# Patient Record
Sex: Female | Born: 1945 | Race: White | Hispanic: No | Smoking: Never smoker
Health system: Southern US, Community
[De-identification: ages and names within clinical notes are randomized; demographics above are authoritative.]

## PROBLEM LIST (undated history)

## (undated) DIAGNOSIS — K219 Gastro-esophageal reflux disease without esophagitis: Secondary | ICD-10-CM

## (undated) DIAGNOSIS — G8929 Other chronic pain: Secondary | ICD-10-CM

## (undated) DIAGNOSIS — A809 Acute poliomyelitis, unspecified: Secondary | ICD-10-CM

## (undated) DIAGNOSIS — E559 Vitamin D deficiency, unspecified: Secondary | ICD-10-CM

## (undated) DIAGNOSIS — F329 Major depressive disorder, single episode, unspecified: Secondary | ICD-10-CM

## (undated) DIAGNOSIS — I10 Essential (primary) hypertension: Secondary | ICD-10-CM

---

## 2021-02-28 ENCOUNTER — Encounter (HOSPITAL_BASED_OUTPATIENT_CLINIC_OR_DEPARTMENT_OTHER): Payer: Self-pay

## 2021-02-28 ENCOUNTER — Emergency Department (HOSPITAL_BASED_OUTPATIENT_CLINIC_OR_DEPARTMENT_OTHER)
Admission: EM | Admit: 2021-02-28 | Discharge: 2021-02-28 | Disposition: A | Discharge status: Home / Self Care | Payer: Medicare Other | Attending: Emergency Medicine | Admitting: Emergency Medicine

## 2021-02-28 ENCOUNTER — Other Ambulatory Visit: Payer: Self-pay

## 2021-02-28 ENCOUNTER — Emergency Department (HOSPITAL_BASED_OUTPATIENT_CLINIC_OR_DEPARTMENT_OTHER): Payer: Medicare Other

## 2021-02-28 DIAGNOSIS — Z79899 Other long term (current) drug therapy: Secondary | ICD-10-CM | POA: Insufficient documentation

## 2021-02-28 DIAGNOSIS — I10 Essential (primary) hypertension: Secondary | ICD-10-CM | POA: Insufficient documentation

## 2021-02-28 DIAGNOSIS — S0990XA Unspecified injury of head, initial encounter: Secondary | ICD-10-CM | POA: Insufficient documentation

## 2021-02-28 DIAGNOSIS — S098XXA Other specified injuries of head, initial encounter: Secondary | ICD-10-CM

## 2021-02-28 DIAGNOSIS — W01198A Fall on same level from slipping, tripping and stumbling with subsequent striking against other object, initial encounter: Secondary | ICD-10-CM | POA: Diagnosis not present

## 2021-02-28 DIAGNOSIS — Y92129 Unspecified place in nursing home as the place of occurrence of the external cause: Secondary | ICD-10-CM | POA: Insufficient documentation

## 2021-02-28 HISTORY — DX: Vitamin D deficiency, unspecified: E55.9

## 2021-02-28 HISTORY — DX: Gastro-esophageal reflux disease without esophagitis: K21.9

## 2021-02-28 HISTORY — DX: Major depressive disorder, single episode, unspecified: F32.9

## 2021-02-28 HISTORY — DX: Other chronic pain: G89.29

## 2021-02-28 HISTORY — DX: Essential (primary) hypertension: I10

## 2021-02-28 HISTORY — DX: Acute poliomyelitis, unspecified: A80.9

## 2021-02-28 MED ORDER — HYDROCODONE-ACETAMINOPHEN 5-325 MG PO TABS
1.0000 | ORAL_TABLET | Freq: Once | ORAL | Status: AC
  Administered 2021-02-28: 1 via ORAL
  Filled 2021-02-28: qty 1

## 2021-02-28 NOTE — ED Triage Notes (Addendum)
Pt BIBA from [facility] for a fall. Pt was using her walker when she slipped and fell backwards and hit her head. Hematoma noted on the back of her head. AAOx4, not on blood thinners and did not have LOC. Denies any other injures. Did not have anything for pain.

## 2021-02-28 NOTE — Discharge Instructions (Addendum)
The CT scan of your brain did not reveal any skull fracture or internal brain bleed.  We suspect that you might have lingering headaches for the next few days.  Take over-the-counter medications for your headaches or your pain medications.  Be very careful with ambulation to prevent future falls.

## 2021-03-01 NOTE — ED Provider Notes (Signed)
[facility] EMERGENCY DEPT Provider Note   CSN: [id number] Arrival date & time: 02/28/21  1949     History Chief Complaint  Patient presents with   Marletta Lor    Katherine Welch is a 75 y.o. female.  HPI    75 year old female comes in with chief complaint of fall.  Patient lives at an assisted living facility.  She reports that she had a mechanical fall earlier today.  She fell backwards and struck the back of her head onto the hard surface.  No loss of consciousness.  Patient is having moderately severe posterior headache.  She denies any neck pain, focal numbness, weakness, vision change, seizure-like activity, confusion.  Patient denies pain elsewhere.  She is not on any blood thinners.  Daughter is at bedside, she provides collateral history as well.  Past Medical History:  Diagnosis Date   Chronic hip pain    GERD (gastroesophageal reflux disease)    Hypertension    Major depressive disorder    Polio    Vitamin D deficiency     There are no problems to display for this patient.    OB History   No obstetric history on file.     No family history on file.  Social History   Substance Use Topics   Alcohol use: Not Currently   Drug use: Never    Home Medications Prior to Admission medications   Medication Sig Start Date End Date Taking? Authorizing Provider  bisoprolol-hydrochlorothiazide (ZIAC) 5-6.25 MG tablet Take 1 tablet by mouth daily.   Yes [provider]  celecoxib (CELEBREX) 200 MG capsule Take 200 mg by mouth 2 (two) times daily.   Yes [provider]  citalopram (CELEXA) 40 MG tablet Take 40 mg by mouth daily.   Yes [provider]  DULoxetine (CYMBALTA) 60 MG capsule Take 60 mg by mouth daily.   Yes [provider]  famotidine (PEPCID) 40 MG tablet Take 40 mg by mouth daily.   Yes [provider]  fexofenadine (ALLEGRA) 180 MG tablet Take 180 mg by mouth daily.   Yes [provider]   fluticasone (FLONASE) 50 MCG/ACT nasal spray Place into both nostrils daily.   Yes [provider]  HYDROcodone-acetaminophen (NORCO) 10-325 MG tablet Take 1 tablet by mouth every 6 (six) hours as needed.   Yes [provider]  losartan (COZAAR) 50 MG tablet Take 50 mg by mouth daily.   Yes [provider]  Magnesium 250 MG TABS Take by mouth.   Yes [provider]  risedronate (ACTONEL) 150 MG tablet Take 150 mg by mouth every 30 (thirty) days. with water on empty stomach, nothing by mouth or lie down for next 30 minutes.   Yes [provider]  tiZANidine (ZANAFLEX) 4 MG tablet Take 4 mg by mouth every 6 (six) hours as needed for muscle spasms.   Yes [provider]  traZODone (DESYREL) 150 MG tablet Take 150 mg by mouth at bedtime.   Yes [provider]    Allergies    Biaxin [clarithromycin] and Penicillins  Review of Systems   Review of Systems  Constitutional:  Positive for activity change.  Eyes:  Negative for visual disturbance.  Neurological:  Positive for headaches.  Hematological:  Does not bruise/bleed easily.  All other systems reviewed and are negative.  Physical Exam Updated Vital Signs BP (!) 146/67 (BP Location: Right Arm)   Pulse 64   Temp 98.2 F (36.8 C) (Oral)  Resp 18   Ht 5\' 3"  (1.6 m)   Wt 74.4 kg   SpO2 96%   BMI 29.05 kg/m   Physical Exam Vitals and nursing note reviewed.  Constitutional:      Appearance: She is well-developed.  HENT:     Head: Atraumatic.  Eyes:     Extraocular Movements: Extraocular movements intact.     Pupils: Pupils are equal, round, and reactive to light.  Cardiovascular:     Rate and Rhythm: Normal rate.  Pulmonary:     Effort: Pulmonary effort is normal.  Musculoskeletal:        General: No swelling, tenderness, deformity or signs of injury.     Cervical back: Normal range of motion and neck supple.     Comments: Head to toe evaluation shows no hematoma,  bleeding of the scalp, no facial abrasions, no spine step offs, crepitus of the chest or neck, no tenderness to palpation of the bilateral upper and lower extremities, no gross deformities, no chest tenderness, no pelvic pain.   Skin:    General: Skin is warm and dry.  Neurological:     Mental Status: She is alert and oriented to person, place, and time.    ED Results / Procedures / Treatments   Labs (all labs ordered are listed, but only abnormal results are displayed) Labs Reviewed - No data to display  EKG None  Radiology CT Head Wo Contrast  Result Date: 02/28/2021 CLINICAL DATA:  Head trauma, minor (Age >= 65y) Head trauma, mod-severe. Fall, head injury EXAM: CT HEAD WITHOUT CONTRAST TECHNIQUE: Contiguous axial images were obtained from the base of the skull through the vertex without intravenous contrast. COMPARISON:  None. FINDINGS: Brain: Normal anatomic configuration. Parenchymal volume loss is commensurate with the patient's age. Mild periventricular white matter changes are present likely reflecting the sequela of small vessel ischemia. No abnormal intra or extra-axial mass lesion or fluid collection. No abnormal mass effect or midline shift. No evidence of acute intracranial hemorrhage or infarct. Ventricular size is normal. Cerebellum unremarkable. Vascular: No asymmetric hyperdense vasculature at the skull base. Skull: Intact Sinuses/Orbits: Paranasal sinuses are clear. Orbits are unremarkable. Other: Mastoid air cells and middle ear cavities are clear. IMPRESSION: No acute intracranial injury. No calvarial fracture. Mild senescent change. Electronically Signed   By: 03/02/2021 M.D.   On: 02/28/2021 21:17    Procedures Procedures   Medications Ordered in ED Medications  HYDROcodone-acetaminophen (NORCO/VICODIN) 5-325 MG per tablet 1 tablet (1 tablet Oral Given 02/28/21 2044)    ED Course  I have reviewed the triage vital signs and the nursing notes.  Pertinent labs &  imaging results that were available during my care of the patient were reviewed by me and considered in my medical decision making (see chart for details).    MDM Rules/Calculators/A&P                            75 year old comes in a chief complaint of headaches. Patient had a mechanical fall earlier today.  She has a hematoma over her occiput.  CT scan ordered, it is negative for any intracranial bleed or skull fracture.  C-spine was cleared clinically.  Her MS exam was overall reassuring and no radiographs are indicated.  Stable for discharge  Final Clinical Impression(s) / ED Diagnoses Final diagnoses:  Blunt head trauma, initial encounter    Rx / DC Orders ED Discharge Orders  None        Derwood Kaplan, MD 03/01/21 [phone]

## 2021-08-05 ENCOUNTER — Emergency Department (HOSPITAL_BASED_OUTPATIENT_CLINIC_OR_DEPARTMENT_OTHER): Payer: Medicare Other

## 2021-08-05 ENCOUNTER — Emergency Department (HOSPITAL_BASED_OUTPATIENT_CLINIC_OR_DEPARTMENT_OTHER): Payer: Medicare Other | Admitting: Radiology

## 2021-08-05 ENCOUNTER — Emergency Department (HOSPITAL_BASED_OUTPATIENT_CLINIC_OR_DEPARTMENT_OTHER)
Admission: EM | Admit: 2021-08-05 | Discharge: 2021-08-05 | Disposition: A | Discharge status: Home / Self Care | Payer: Medicare Other | Attending: Emergency Medicine | Admitting: Emergency Medicine

## 2021-08-05 ENCOUNTER — Encounter (HOSPITAL_BASED_OUTPATIENT_CLINIC_OR_DEPARTMENT_OTHER): Payer: Self-pay | Admitting: Emergency Medicine

## 2021-08-05 ENCOUNTER — Other Ambulatory Visit: Payer: Self-pay

## 2021-08-05 DIAGNOSIS — S0990XA Unspecified injury of head, initial encounter: Secondary | ICD-10-CM | POA: Diagnosis present

## 2021-08-05 DIAGNOSIS — W19XXXA Unspecified fall, initial encounter: Secondary | ICD-10-CM

## 2021-08-05 DIAGNOSIS — R296 Repeated falls: Secondary | ICD-10-CM | POA: Diagnosis not present

## 2021-08-05 DIAGNOSIS — R102 Pelvic and perineal pain: Secondary | ICD-10-CM

## 2021-08-05 DIAGNOSIS — W01198A Fall on same level from slipping, tripping and stumbling with subsequent striking against other object, initial encounter: Secondary | ICD-10-CM | POA: Diagnosis not present

## 2021-08-05 MED ORDER — FENTANYL CITRATE PF 50 MCG/ML IJ SOSY
100.0000 ug | PREFILLED_SYRINGE | Freq: Once | INTRAMUSCULAR | Status: AC
  Administered 2021-08-05: 22:00:00 100 ug via INTRAMUSCULAR
  Filled 2021-08-05: qty 2

## 2021-08-05 MED ORDER — OXYCODONE-ACETAMINOPHEN 5-325 MG PO TABS
1.0000 | ORAL_TABLET | Freq: Once | ORAL | Status: AC
  Administered 2021-08-05: 21:00:00 1 via ORAL
  Filled 2021-08-05: qty 1

## 2021-08-05 MED ORDER — KETOROLAC TROMETHAMINE 30 MG/ML IJ SOLN
30.0000 mg | Freq: Once | INTRAMUSCULAR | Status: AC
  Administered 2021-08-05: 21:00:00 30 mg via INTRAMUSCULAR
  Filled 2021-08-05: qty 1

## 2021-08-05 NOTE — ED Triage Notes (Signed)
Pt BIB by [facility] , was standing up and fell hitting left hip than rt and then back of head has a bump  there, no loc has  hx of polio as child walks with brace on rt leg

## 2021-08-05 NOTE — Discharge Instructions (Addendum)
Follow-up with your doctors for the pain.  Your CT scan was reassuring today and showed some arthritis but no acute fracture.  Take your pain medicines to help with the pain

## 2021-08-05 NOTE — ED Provider Notes (Signed)
[facility]   CSN: [id number] Arrival date & time: 08/05/21  1806     History  Chief Complaint  Patient presents with   Marletta LorFall    Katherine Welch is a 76 y.o. female.   Fall Pertinent negatives include no abdominal pain, no headaches and no shortness of breath. Patient presents after fall.  Mechanical fall.  Lost her balance and landed onto her rear end.  Complaining of pain in her rear end and both hips.  Also hit her head.  No loss conscious.  Does have previous history of polio and is on state baseline.  States she has not fallen in the last year which is actually pretty good for her.  No loss conscious.  Not on blood thinners.  Has been ambulatory to the bathroom since the fall.  No back pain.  No chest pain.     Home Medications Prior to Admission medications   Medication Sig Start Date End Date Taking? Authorizing Provider  bisoprolol-hydrochlorothiazide (ZIAC) 5-6.25 MG tablet Take 1 tablet by mouth daily.    [provider]  celecoxib (CELEBREX) 200 MG capsule Take 200 mg by mouth 2 (two) times daily.    [provider]  citalopram (CELEXA) 40 MG tablet Take 40 mg by mouth daily.    [provider]  DULoxetine (CYMBALTA) 60 MG capsule Take 60 mg by mouth daily.    [provider]  famotidine (PEPCID) 40 MG tablet Take 40 mg by mouth daily.    [provider]  fexofenadine (ALLEGRA) 180 MG tablet Take 180 mg by mouth daily.    [provider]  fluticasone (FLONASE) 50 MCG/ACT nasal spray Place into both nostrils daily.    [provider]  HYDROcodone-acetaminophen (NORCO) 10-325 MG tablet Take 1 tablet by mouth every 6 (six) hours as needed.    [provider]  losartan (COZAAR) 50 MG tablet Take 50 mg by mouth daily.    [provider]  Magnesium 250 MG TABS Take by mouth.    [provider]  risedronate (ACTONEL) 150 MG tablet Take 150 mg  by mouth every 30 (thirty) days. with water on empty stomach, nothing by mouth or lie down for next 30 minutes.    [provider]  tiZANidine (ZANAFLEX) 4 MG tablet Take 4 mg by mouth every 6 (six) hours as needed for muscle spasms.    [provider]  traZODone (DESYREL) 150 MG tablet Take 150 mg by mouth at bedtime.    [provider]      Allergies    Biaxin [clarithromycin] and Penicillins    Review of Systems   Review of Systems  Constitutional:  Negative for appetite change.  Respiratory:  Negative for shortness of breath.   Cardiovascular:  Negative for leg swelling.  Gastrointestinal:  Negative for abdominal pain.  Musculoskeletal:        Pelvic pain.  Bilateral hip pain.  Skin:  Negative for rash.  Neurological:  Negative for weakness and headaches.  Psychiatric/Behavioral:  Negative for confusion.    Physical Exam Updated Vital Signs BP (!) 189/84    Pulse 90    Temp 97.6 F (36.4 C)    Resp 18    Ht 5\' 5"  (1.651 m)    Wt 83.9 kg    SpO2 96%    BMI 30.79 kg/m  Physical Exam Vitals and nursing note reviewed.  HENT:     Head:  Comments: Hematoma right occipital area. Pulmonary:     Breath sounds: No wheezing or rhonchi.  Abdominal:     Tenderness: There is no abdominal tenderness.  Musculoskeletal:     Comments: Pain in posterior pelvis with raising of either lower extremity.  Good range of motion in hips.  Some tenderness over inferior sacrum/coccyx.  No thoracic or lumbar or cervical spine tenderness  Skin:    General: Skin is warm.     Capillary Refill: Capillary refill takes more than 3 seconds.  Neurological:     Mental Status: She is alert and oriented to person, place, and time.    ED Results / Procedures / Treatments   Labs (all labs ordered are listed, but only abnormal results are displayed) Labs Reviewed - No data to display  EKG None  Radiology CT Head Wo Contrast  Result Date: 08/05/2021 CLINICAL DATA:  Trauma  EXAM: CT HEAD WITHOUT CONTRAST TECHNIQUE: Contiguous axial images were obtained from the base of the skull through the vertex without intravenous contrast. RADIATION DOSE REDUCTION: This exam was performed according to the departmental dose-optimization program which includes automated exposure control, adjustment of the mA and/or kV according to patient size and/or use of iterative reconstruction technique. COMPARISON:  02/28/2021 FINDINGS: Brain: No acute intracranial findings are seen. There is no evidence of intracranial bleeding. Cortical sulci are prominent. There is decreased density in periventricular and subcortical white matter. Vascular: Unremarkable. Skull: No fracture is seen. There is subcutaneous contusion/hematoma in the posterior right parietal scalp. Sinuses/Orbits: Unremarkable. Other: There is increased amount of CSF insula suggesting partial empty sella. IMPRESSION: No acute intracranial findings are seen. Atrophy. Small-vessel disease. There is subcutaneous contusion/hematoma in the right parietal scalp without demonstrable fracture in calvarium. Electronically Signed   By: Ernie Avena M.D.   On: 08/05/2021 19:16   CT Hip Left Wo Contrast  Result Date: 08/05/2021 CLINICAL DATA:  Trauma to the left hip.  Concern for fracture. EXAM: CT OF THE LEFT HIP WITHOUT CONTRAST TECHNIQUE: Multidetector CT imaging of the left hip was performed according to the standard protocol. Multiplanar CT image reconstructions were also generated. RADIATION DOSE REDUCTION: This exam was performed according to the departmental dose-optimization program which includes automated exposure control, adjustment of the mA and/or kV according to patient size and/or use of iterative reconstruction technique. COMPARISON:  Left hip radiograph dated 08/05/2021. FINDINGS: Bones/Joint/Cartilage There is no acute fracture or dislocation. 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 Old healed fracture of the left pubic bone.  Ligaments Suboptimally assessed by CT. Muscles and Tendons No acute findings.  No intramuscular fluid collection or hematoma. Soft tissues Sigmoid diverticulosis. IMPRESSION: 1. No acute fracture or dislocation. 2. 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 Electronically Signed   By: Elgie Collard M.D.   On: 08/05/2021 20:48   DG Hips Bilat W or Wo Pelvis 5 Views  Result Date: 08/05/2021 CLINICAL DATA:  Bilateral hip pain following a fall. EXAM: DG HIP (WITH OR WITHOUT PELVIS) 5+V BILAT COMPARISON:  None. FINDINGS: Diffuse osteopenia. Bony hypertrophy of the left pubic bone with an appearance compatible with an old, healed fracture. There is also a possible small area of cortical disruption in the superior aspect of the left pubic bone. Also demonstrated is a small cortical irregularity with an appearance suggesting a minimally impacted fracture of the left subcapital femoral neck. The right hip is intact with no fracture or dislocation seen. Mild bilateral hip degenerative spur formation is noted. Also noted are  degenerative changes in the lower lumbar spine. Multiple right inguinal and thigh surgical clips are noted. IMPRESSION: 1. Possible mildly impacted left subcapital femoral neck fracture. 2. Probable old, healed left pubic body fracture with a possible acute component superiorly. 3. No right hip fracture or dislocation seen. Recommendation: Left hip CT without contrast. Electronically Signed   By: Beckie Salts M.D.   On: 08/05/2021 19:11    Procedures Procedures    Medications Ordered in ED Medications  ketorolac (TORADOL) 30 MG/ML injection 30 mg (30 mg Intramuscular Given 08/05/21 2058)  oxyCODONE-acetaminophen (PERCOCET/ROXICET) 5-325 MG per tablet 1 tablet (1 tablet Oral Given 08/05/21 2058)  fentaNYL (SUBLIMAZE) injection 100 mcg (100 mcg Intramuscular Given 08/05/21 2146)    ED Course/ Medical Decision Making/ A&P                           Medical Decision Making Problems  Addressed: Fall, initial encounter: acute illness or injury Minor head injury, initial encounter: acute illness or injury Pelvic pain: acute illness or injury  Amount and/or Complexity of Data Reviewed Radiology: ordered.  Risk Prescription drug management.   Patient with mechanical fall.  History of polio which makes her somewhat unsteady at baseline.  States she does have rather frequent falls.  Complaining of pain in an pelvis and hips.  Head CT done due to hematoma and independently reviewed and showed no hemorrhage.  Independently reviewed and interpreted x-rays also.  Possible left-sided femoral neck fracture.  Also potential old pubic body versus new fracture.  CT scan done and also reviewed and interpreted by me.  No acute fracture.  Patient with continued pain.  Treated with IV pain medicines here.  Already has hydrocodone 10 mg at home.  We will continue this and follow-up with her doctors.  No other apparent injury.  Discharge home.        Final Clinical Impression(s) / ED Diagnoses Final diagnoses:  Fall, initial encounter  Pelvic pain  Minor head injury, initial encounter    Rx / DC Orders ED Discharge Orders     None         Benjiman Core, MD 08/06/21 1230

## 2021-08-05 NOTE — ED Notes (Signed)
Pt verbalizes understanding of discharge instructions. Opportunity for questioning and answers were provided. Pt discharged from ED to home with daughter. Attempted to call report to the facility x4. No answer from facility. Instructions reviewed with daughter and pt with understanding verbalized by both. Pt POV with daughter to facility

## 2021-08-13 ENCOUNTER — Emergency Department (HOSPITAL_COMMUNITY): Payer: Medicare Other

## 2021-08-13 ENCOUNTER — Emergency Department (HOSPITAL_COMMUNITY)
Admission: EM | Admit: 2021-08-13 | Discharge: 2021-08-13 | Disposition: A | Discharge status: Home / Self Care | Payer: Medicare Other | Attending: Student | Admitting: Student

## 2021-08-13 DIAGNOSIS — Z79899 Other long term (current) drug therapy: Secondary | ICD-10-CM | POA: Insufficient documentation

## 2021-08-13 DIAGNOSIS — S32000A Wedge compression fracture of unspecified lumbar vertebra, initial encounter for closed fracture: Secondary | ICD-10-CM

## 2021-08-13 DIAGNOSIS — W19XXXA Unspecified fall, initial encounter: Secondary | ICD-10-CM | POA: Insufficient documentation

## 2021-08-13 DIAGNOSIS — I10 Essential (primary) hypertension: Secondary | ICD-10-CM | POA: Insufficient documentation

## 2021-08-13 DIAGNOSIS — S3210XA Unspecified fracture of sacrum, initial encounter for closed fracture: Secondary | ICD-10-CM

## 2021-08-13 DIAGNOSIS — M25551 Pain in right hip: Secondary | ICD-10-CM | POA: Insufficient documentation

## 2021-08-13 DIAGNOSIS — M533 Sacrococcygeal disorders, not elsewhere classified: Secondary | ICD-10-CM | POA: Insufficient documentation

## 2021-08-13 MED ORDER — MORPHINE SULFATE (PF) 4 MG/ML IV SOLN
4.0000 mg | Freq: Once | INTRAVENOUS | Status: AC
  Administered 2021-08-13: 4 mg via INTRAMUSCULAR
  Filled 2021-08-13: qty 1

## 2021-08-13 MED ORDER — HYDROCODONE-ACETAMINOPHEN 5-325 MG PO TABS
1.0000 | ORAL_TABLET | Freq: Once | ORAL | Status: AC
  Administered 2021-08-13: 1 via ORAL
  Filled 2021-08-13: qty 1

## 2021-08-13 MED ORDER — OXYCODONE HCL 5 MG PO TABS
5.0000 mg | ORAL_TABLET | Freq: Once | ORAL | Status: AC
  Administered 2021-08-13: 5 mg via ORAL
  Filled 2021-08-13: qty 1

## 2021-08-13 MED ORDER — HYDROCODONE-ACETAMINOPHEN 5-325 MG PO TABS
2.0000 | ORAL_TABLET | ORAL | 0 refills | Status: DC | PRN
Start: 2021-08-13 — End: 2021-11-06

## 2021-08-13 NOTE — ED Notes (Signed)
[facility] called for report. RN phone goes to voicemail x2. Message left. Family agrees to transport patient home.

## 2021-08-13 NOTE — ED Triage Notes (Signed)
Patient BIB [facility] from [facility] With complaint of lower back pain after a fall last week. Facility has been giving patient tylenol but patient states that does not help the pain. Patient ambulates with a walker a baseline, facility states patient is still able to ambulate with Rolator but more slowly.  EMS Vitals BP 186/92 HR 78 RR 20 SpO2 97% on room air Oral temp 97.6

## 2021-08-13 NOTE — ED Notes (Signed)
Pt verbalized understanding of d/c instructions, meds, and followup care. Denies questions. VSS, no distress noted. [facility] called x[facility]-message left. Family is wanting to take patient home now. Assisted to wheelchair x2. Helped into daughter's car.

## 2021-08-13 NOTE — ED Provider Notes (Signed)
[facility] EMERGENCY DEPARTMENT Provider Note  CSN: [id number] Arrival date & time: 08/13/21 0370  Chief Complaint(s) Fall  HPI Katherine Welch is a 76 y.o. 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  Patient had a fall 6 days ago and was evaluated in the emergency department with no acute traumatic injuries and states that 2 days after evaluation he had severe worsening of pain in the sacrum and in the right hip.  Previous trauma imaging on 08/05/2021 was CT of the left hip, head and x-rays of hips.  She states that her current facility has not been giving her oxycodone despite being prescribed this medication.  She currently denies chest pain, shortness of breath, headache, nausea, vomiting or other systemic symptoms or traumatic complaints.   Fall   Past Medical History Past Medical History:  Diagnosis Date   Chronic hip pain    GERD (gastroesophageal reflux disease)    Hypertension    Major depressive disorder    Polio    Vitamin D deficiency    There are no problems to display for this patient.  Home Medication(s) Prior to Admission medications   Medication Sig Start Date End Date Taking? Authorizing Provider  bisoprolol-hydrochlorothiazide (ZIAC) 5-6.25 MG tablet Take 1 tablet by mouth daily.    [provider]  celecoxib (CELEBREX) 200 MG capsule Take 200 mg by mouth 2 (two) times daily.    [provider]  citalopram (CELEXA) 40 MG tablet Take 40 mg by mouth daily.    [provider]  DULoxetine (CYMBALTA) 60 MG capsule Take 60 mg by mouth daily.    [provider]  famotidine (PEPCID) 40 MG tablet Take 40 mg by mouth daily.    [provider]  fexofenadine (ALLEGRA) 180 MG tablet Take 180 mg by mouth daily.    [provider]  fluticasone (FLONASE) 50  MCG/ACT nasal spray Place into both nostrils daily.    [provider]  HYDROcodone-acetaminophen (NORCO) 10-325 MG tablet Take 1 tablet by mouth every 6 (six) hours as needed.    [provider]  losartan (COZAAR) 50 MG tablet Take 50 mg by mouth daily.    [provider]  Magnesium 250 MG TABS Take by mouth.    [provider]  risedronate (ACTONEL) 150 MG tablet Take 150 mg by mouth every 30 (thirty) days. with water on empty stomach, nothing by mouth or lie down for next 30 minutes.    [provider]  tiZANidine (ZANAFLEX) 4 MG tablet Take 4 mg by mouth every 6 (six) hours as needed for muscle spasms.    [provider]  traZODone (DESYREL) 150 MG tablet Take 150 mg by mouth at bedtime.    [provider]  Past Surgical History No past surgical history on file. Family History No family history on file.  Social History Social History   Tobacco Use   Smoking status: Never    Passive exposure: Never   Smokeless tobacco: Never  Vaping Use   Vaping Use: Never used  Substance Use Topics   Alcohol use: Not Currently   Drug use: Never   Allergies Biaxin [clarithromycin] and Penicillins  Review of Systems Review of Systems  Musculoskeletal:  Positive for arthralgias and back pain.   Physical Exam Vital Signs  I have reviewed the triage vital signs BP (!) 164/69 (BP Location: Right Arm)    Pulse 79    Temp 97.9 F (36.6 C) (Oral)    Resp 15    Ht 5\' 5"  (1.651 m)    Wt 83.9 kg    SpO2 98%    BMI 30.78 kg/m   Physical Exam Vitals and nursing note reviewed.  Constitutional:      General: She is not in acute distress.    Appearance: She is well-developed.  HENT:     Head: Normocephalic and atraumatic.  Eyes:     Conjunctiva/sclera: Conjunctivae normal.  Cardiovascular:     Rate and  Rhythm: Normal rate and regular rhythm.     Heart sounds: No murmur heard. Pulmonary:     Effort: Pulmonary effort is normal. No respiratory distress.     Breath sounds: Normal breath sounds.  Abdominal:     Palpations: Abdomen is soft.     Tenderness: There is no abdominal tenderness.  Musculoskeletal:        General: Tenderness (Right hip, L-spine) present. No swelling.     Cervical back: Neck supple.  Skin:    General: Skin is warm and dry.     Capillary Refill: Capillary refill takes less than 2 seconds.  Neurological:     Mental Status: She is alert.  Psychiatric:        Mood and Affect: Mood normal.    ED Results and Treatments Labs (all labs ordered are listed, but only abnormal results are displayed) Labs Reviewed - No data to display                                                                                                                        Radiology No results found.  Pertinent labs & imaging results that were available during my care of the patient were reviewed by me and considered in my medical decision making (see MDM for details).  Medications Ordered in ED Medications  HYDROcodone-acetaminophen (NORCO/VICODIN) 5-325 MG per tablet 1 tablet (1 tablet Oral Given 08/13/21 1024)  Procedures Procedures  (including critical care time)  Medical Decision Making / ED Course   This patient presents to the ED for concern of back pain after a fall, this involves an extensive number of treatment options, and is a complaint that carries with it a high risk of complications and morbidity.  The differential diagnosis includes sacral fracture, compression fracture, ligamentous injury, hip fracture,  MDM: Patient seen emergency department for evaluation of back pain after a fall.  Physical exam reveals point tenderness in the sacrum and  tenderness with external rotation of the right hip.  CT imaging reveals multiple lumbar compression fractures, age-indeterminate at T12, likely chronic at L4, acute sacral fracture at S2.  CT hip with no acute hip fracture.  I discussed these findings with the patient and she is adamantly states that she does not want surgery regardless on neurosurgery recommendations.  Neurosurgery consulted who states that the patient's mindset is reasonable in this case and is recommending LSO bracing and pain control.  I provided the patient with a short-term refill of her pain medication and an LSO brace was applied.  Patient was then discharged back to her facility.  She has an appointment with a geriatric physician next week but [facility] and orthopedic follow-up was given in the discharge instructions.   Additional history obtained: -Additional history obtained from daughter -External records from outside source obtained and reviewed including: Chart review including previous notes, labs, imaging, consultation notes   Lab Tests: -I ordered, reviewed, and interpreted labs.   The pertinent results include:   Labs Reviewed - No data to display   Imaging Studies ordered: I ordered imaging studies including CT L spine, CT R hip I independently visualized and interpreted imaging. I agree with the radiologist interpretation   Medicines ordered and prescription drug management: Meds ordered this encounter  Medications   HYDROcodone-acetaminophen (NORCO/VICODIN) 5-325 MG per tablet 1 tablet    -I have reviewed the patients home medicines and have made adjustments as needed  Critical interventions none  Consultations Obtained: I requested consultation with the surgeon,  and discussed lab and imaging findings as well as pertinent plan - they recommend: LSO bracing and outpatient follow-up   Cardiac Monitoring: The patient was maintained on a cardiac monitor.  I personally viewed and  interpreted the cardiac monitored which showed an underlying rhythm of: Normal sinus rhythm  Social Determinants of Health:  Factors impacting patients care include: none   Reevaluation: After the interventions noted above, I reevaluated the patient and found that they have :improved  Co morbidities that complicate the patient evaluation  Past Medical History:  Diagnosis Date   Chronic hip pain    GERD (gastroesophageal reflux disease)    Hypertension    Major depressive disorder    Polio    Vitamin D deficiency       Dispostion: I considered admission for this patient, but the patient does not meet inpatient criteria and is safe for outpatient follow-up.       Final Clinical Impression(s) / ED Diagnoses Final diagnoses:  None     @PCDICTATION @    Glendora ScoreKommor, Cassundra Mckeever, MD 08/13/21 [phone]

## 2021-08-13 NOTE — ED Notes (Signed)
Called ptar for pt #14 on the list

## 2021-08-13 NOTE — Progress Notes (Signed)
Orthopedic Tech Progress Note Patient Details:  Katherine Welch 05-18-1946 [id number]  Ortho Devices Type of Ortho Device: Lumbar corsett Ortho Device/Splint Interventions: Ordered, Application, Adjustment   Post Interventions Patient Tolerated: Well Instructions Provided: Adjustment of device, Care of device Applied LSO rather than TLSO per request of MD.  Darleen Crocker 08/13/2021, 2:53 PM

## 2021-08-13 NOTE — ED Notes (Signed)
Called CT for ETA.  

## 2021-08-16 ENCOUNTER — Emergency Department (HOSPITAL_BASED_OUTPATIENT_CLINIC_OR_DEPARTMENT_OTHER)
Admission: EM | Admit: 2021-08-16 | Discharge: 2021-08-16 | Disposition: A | Discharge status: Home / Self Care | Payer: Medicare Other | Attending: Emergency Medicine | Admitting: Emergency Medicine

## 2021-08-16 ENCOUNTER — Encounter (HOSPITAL_BASED_OUTPATIENT_CLINIC_OR_DEPARTMENT_OTHER): Payer: Self-pay | Admitting: Emergency Medicine

## 2021-08-16 DIAGNOSIS — F29 Unspecified psychosis not due to a substance or known physiological condition: Secondary | ICD-10-CM | POA: Insufficient documentation

## 2021-08-16 DIAGNOSIS — Z20822 Contact with and (suspected) exposure to covid-19: Secondary | ICD-10-CM | POA: Diagnosis not present

## 2021-08-16 DIAGNOSIS — F329 Major depressive disorder, single episode, unspecified: Secondary | ICD-10-CM | POA: Diagnosis not present

## 2021-08-16 DIAGNOSIS — W19XXXD Unspecified fall, subsequent encounter: Secondary | ICD-10-CM | POA: Insufficient documentation

## 2021-08-16 DIAGNOSIS — R45851 Suicidal ideations: Secondary | ICD-10-CM | POA: Diagnosis not present

## 2021-08-16 DIAGNOSIS — S3992XD Unspecified injury of lower back, subsequent encounter: Secondary | ICD-10-CM | POA: Diagnosis present

## 2021-08-16 DIAGNOSIS — S322XXD Fracture of coccyx, subsequent encounter for fracture with routine healing: Secondary | ICD-10-CM | POA: Insufficient documentation

## 2021-08-16 DIAGNOSIS — G8929 Other chronic pain: Secondary | ICD-10-CM | POA: Diagnosis not present

## 2021-08-16 DIAGNOSIS — S3210XD Unspecified fracture of sacrum, subsequent encounter for fracture with routine healing: Secondary | ICD-10-CM | POA: Insufficient documentation

## 2021-08-16 DIAGNOSIS — F4329 Adjustment disorder with other symptoms: Secondary | ICD-10-CM | POA: Insufficient documentation

## 2021-08-16 LAB — COMPREHENSIVE METABOLIC PANEL
ALT: 9 U/L (ref 0–44)
AST: 12 U/L — ABNORMAL LOW (ref 15–41)
Albumin: 3.9 g/dL (ref 3.5–5.0)
Alkaline Phosphatase: 98 U/L (ref 38–126)
Anion gap: 9 (ref 5–15)
BUN: 23 mg/dL (ref 8–23)
CO2: 27 mmol/L (ref 22–32)
Calcium: 10.8 mg/dL — ABNORMAL HIGH (ref 8.9–10.3)
Chloride: 101 mmol/L (ref 98–111)
Creatinine, Ser: 0.9 mg/dL (ref 0.44–1.00)
GFR, Estimated: >60 mL/min (ref >60)
Glucose, Bld: 156 mg/dL — ABNORMAL HIGH (ref 70–99)
Potassium: 3.2 mmol/L — ABNORMAL LOW (ref 3.5–5.1)
Sodium: 137 mmol/L (ref 135–145)
Total Bilirubin: 0.7 mg/dL (ref 0.3–1.2)
Total Protein: 7.3 g/dL (ref 6.5–8.1)

## 2021-08-16 LAB — CBC WITH DIFFERENTIAL/PLATELET
Abs Immature Granulocytes: 0.07 10*3/uL (ref 0.00–0.07)
Basophils Absolute: 0.1 10*3/uL (ref 0.0–0.1)
Basophils Relative: 1 %
Eosinophils Absolute: 0.1 10*3/uL (ref 0.0–0.5)
Eosinophils Relative: 1 %
HCT: 39.2 % (ref 36.0–46.0)
Hemoglobin: 12.9 g/dL (ref 12.0–15.0)
Immature Granulocytes: 1 %
Lymphocytes Relative: 17 %
Lymphs Abs: 1.9 10*3/uL (ref 0.7–4.0)
MCH: 28.9 pg (ref 26.0–34.0)
MCHC: 32.9 g/dL (ref 30.0–36.0)
MCV: 87.7 fL (ref 80.0–100.0)
Monocytes Absolute: 0.8 10*3/uL (ref 0.1–1.0)
Monocytes Relative: 7 %
Neutro Abs: 7.8 10*3/uL — ABNORMAL HIGH (ref 1.7–7.7)
Neutrophils Relative %: 73 %
Platelets: 320 10*3/uL (ref 150–400)
RBC: 4.47 MIL/uL (ref 3.87–5.11)
RDW: 12.6 % (ref 11.5–15.5)
WBC: 10.7 10*3/uL — ABNORMAL HIGH (ref 4.0–10.5)
nRBC: 0 % (ref 0.0–0.2)

## 2021-08-16 LAB — RESP PANEL BY RT-PCR (FLU A&B, COVID) ARPGX2
Influenza A by PCR: NEGATIVE
Influenza B by PCR: NEGATIVE
SARS Coronavirus 2 by RT PCR: NEGATIVE

## 2021-08-16 LAB — RAPID URINE DRUG SCREEN, HOSP PERFORMED
Amphetamines: NOT DETECTED
Barbiturates: NOT DETECTED
Benzodiazepines: NOT DETECTED
Cocaine: NOT DETECTED
Opiates: POSITIVE — AB
Tetrahydrocannabinol: NOT DETECTED

## 2021-08-16 LAB — ETHANOL: Alcohol, Ethyl (B): 10 mg/dL — ABNORMAL HIGH (ref <10)

## 2021-08-16 MED ORDER — HYDROMORPHONE HCL 1 MG/ML IJ SOLN
1.0000 mg | Freq: Once | INTRAMUSCULAR | Status: AC
  Administered 2021-08-16: 1 mg via INTRAVENOUS
  Filled 2021-08-16: qty 1

## 2021-08-16 MED ORDER — OXYCODONE-ACETAMINOPHEN 5-325 MG PO TABS: 1.0000 | ORAL_TABLET | Freq: Once | ORAL | Status: DC

## 2021-08-16 NOTE — ED Notes (Signed)
T.T.S. is beginning at this time.

## 2021-08-16 NOTE — ED Provider Notes (Signed)
[facility] EMERGENCY DEPT Provider Note   CSN: [id number] Arrival date & time: 08/16/21  1114     History  Chief Complaint  Patient presents with   Back Pain    Katherine Welch is a 76 y.o. female.  This is a 76 year old patient who was recently diagnosed with a broken sacrum who presents to the ED for worsening pain and suicidal ideation.  Patient initially had a fall on 08/04/2021, however the broken tailbone was not noticed until she came back to the emergency department on 08/13/2021.  She was given a back brace and her pain medications were adjusted patient so that patient can take smaller doses of hydrocodone more frequently throughout the day instead of less frequent higher doses.  Currently she is on hydrocodone 5 mg at home, and she does not wear the back brace at her facility.  She states her pain is not being managed appropriately at this dose.  Per daughter, patient called her this morning hysterical crying in pain, saying that if she could not get a handle on this pain she was going to kill her self.  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  When I asked patient if she wanted to hurt her self, she said "I do not know I just want my pain to go away".  She denies saddle paresthesia, urinary or bowel incontinence or retention.  She has no numbness or tingling.  Patient states she is able to walk with her walker, however daughter states that recently she has taken to letting people push her around in a wheelchair.  Although patient lives at a living facility where patient has help moving around, daughter also has concerns about transferring patient to and from her appointments given the decrease in mobility.  She has no other complaints at this time.   Back Pain Associated symptoms: no abdominal pain, no fever and no headaches       Home Medications Prior to Admission  medications   Medication Sig Start Date End Date Taking? Authorizing Provider  acetaminophen (TYLENOL) 500 MG tablet Take 500 mg by mouth every 8 (eight) hours as needed (pain).    [provider]  atorvastatin (LIPITOR) 10 MG tablet Take 10 mg by mouth at bedtime.    [provider]  bisoprolol-hydrochlorothiazide (ZIAC) 5-6.25 MG tablet Take 1 tablet by mouth every morning.    [provider]  celecoxib (CELEBREX) 200 MG capsule Take 200 mg by mouth 2 (two) times daily. 8am, 5pm    [provider]  Cholecalciferol (VITAMIN D3) 50 MCG (2000 UT) capsule Take 2,000 Units by mouth every morning.    [provider]  citalopram (CELEXA) 20 MG tablet Take 20 mg by mouth every morning.    [provider]  DULoxetine (CYMBALTA) 60 MG capsule Take 60 mg by mouth at bedtime.    [provider]  ferrous sulfate 325 (65 FE) MG tablet Take 325 mg by mouth every evening. 5pm    [provider]  fexofenadine (ALLEGRA) 180 MG tablet Take 180 mg by mouth every morning.    [provider]  fluticasone (FLONASE) 50 MCG/ACT nasal spray Place 1 spray into both nostrils daily as needed (seasonal allergies).    [provider]  HYDROcodone-acetaminophen (NORCO) 10-325 MG tablet Take 1 tablet by mouth 3 (three) times daily as needed (pain). Patient not taking: Reported on 08/13/2021  [provider]  HYDROcodone-acetaminophen (NORCO/VICODIN) 5-325 MG tablet Take 2 tablets by mouth every 4 (four) hours as needed. 08/13/21   Kommor, [place], MD  losartan (COZAAR) 50 MG tablet Take 50 mg by mouth every morning.    [provider]  omeprazole (PRILOSEC) 20 MG capsule Take 20 mg by mouth daily before breakfast.    [provider]  risedronate (ACTONEL) 150 MG tablet Take 150 mg by mouth every 30 (thirty) days. Take with water on empty stomach. Nothing by mouth and do not lie down for next 30 minutes.    [provider]  tiZANidine (ZANAFLEX) 4 MG tablet Take 4 mg by mouth every 8 (eight) hours as needed for muscle spasms.    [provider]  traZODone (DESYREL) 100 MG tablet Take 100 mg by mouth at bedtime as needed for sleep.    [provider]      Allergies    Lisinopril, Biaxin [clarithromycin], and Penicillins    Review of Systems   Review of Systems  Constitutional:  Negative for fever.  HENT: Negative.    Eyes: Negative.   Respiratory:  Negative for shortness of breath.   Cardiovascular: Negative.   Gastrointestinal:  Negative for abdominal pain and vomiting.  Endocrine: Negative.   Genitourinary: Negative.   Musculoskeletal:  Positive for back pain.  Skin:  Negative for rash.  Neurological:  Negative for headaches.  Psychiatric/Behavioral:  Positive for suicidal ideas.   All other systems reviewed and are negative.  Physical Exam Updated Vital Signs BP (!) 175/75 (BP Location: Right Arm)    Pulse 65    Temp 98.1 F (36.7 C) (Oral)    Resp 20    SpO2 95%  Physical Exam Vitals and nursing note reviewed.  Constitutional:      General: She is not in acute distress.    Appearance: She is not ill-appearing.     Comments: Chronically ill-appearing  HENT:     Head: Atraumatic.  Eyes:     Conjunctiva/sclera: Conjunctivae normal.  Cardiovascular:     Rate and Rhythm: Normal rate and regular rhythm.     Pulses: Normal pulses.     Heart sounds: No murmur heard. Pulmonary:     Effort: Pulmonary effort is normal. No respiratory distress.     Breath sounds: Normal breath sounds.  Abdominal:     General: Abdomen is flat. There is no distension.     Palpations: Abdomen is soft.     Tenderness: There is no abdominal tenderness.  Musculoskeletal:        General: Normal range of motion.     Cervical back: Normal range of motion.  Skin:    General: Skin is warm and dry.     Capillary Refill: Capillary refill takes less than 2 seconds.  Neurological:      General: No focal deficit present.     Mental Status: She is alert.     Comments: Speech is clear, able to follow commands CN III-XII intact Normal strength in upper and lower extremities bilaterally including dorsiflexion and plantar flexion, strong and equal grip strength Sensation normal to light and sharp touch Moves extremities without ataxia, coordination intact Normal finger to nose and rapid alternating movements No pronator drift    Psychiatric:        Mood and Affect: Mood normal.    ED Results / Procedures / Treatments   Labs (all labs ordered are listed, but only abnormal results are displayed) Labs  Reviewed - No data to display  EKG None  Radiology No results found.  Procedures Procedures    Medications Ordered in ED Medications  HYDROmorphone (DILAUDID) injection 1 mg (1 mg Intravenous Given 08/16/21 1337)  HYDROmorphone (DILAUDID) injection 1 mg (1 mg Intravenous Given 08/16/21 1556)    ED Course/ Medical Decision Making/ A&P                           Medical Decision Making Amount and/or Complexity of Data Reviewed Labs: ordered.  Risk Prescription drug management.   History:  This is a 76 year old patient who was recently diagnosed with a broken sacrum who presents to the ED for worsening pain and suicidal ideation.  Patient initially had a fall on 08/04/2021, however the broken tailbone was not noticed until she came back to the emergency department on 08/13/2021.  Per chart review, she was given a back brace and her pain medications were adjusted patient so that patient can take smaller doses of hydrocodone more frequently throughout the day instead of less frequent higher doses.  Currently she is on hydrocodone 5 mg at home, and she does not wear the back brace at her facility.  She states her pain is not being managed appropriately at this dose.  Per daughter, patient called her this morning hysterical crying in pain, saying that if she could not get a  handle on this pain she was going to kill her self.  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  When I asked patient if she wanted to hurt her self, she said "I do not know I just want my pain to go away".  She denies saddle paresthesia, urinary or bowel incontinence or retention.  She has no numbness or tingling.  Patient states she is able to walk with her walker, however daughter states that recently she has taken to letting people push her around in a wheelchair.  Although patient lives at a living facility where patient has help moving around, daughter also has concerns about transferring patient to and from her appointments given the decrease in mobility.  She has no other complaints at this time.  Initial impression:  Is a 28-year woman who sustained a sacral compression fracture about 1-1/2 weeks ago and is complaining of worsening pain and decreasing mobility. This involves an extensive number of treatment options, and is a complaint that carries with it a high risk of complications and morbidity.  Physical exam was not concerning for neurovascular compromise.  Low concern for cauda equina given that patient is ambulatory although with pain.  Neuro exam was otherwise unremarkable.  I will obtain TTS consult for her mental health and attempt to manage pain here in the ED.   Lab Tests and EKG:  I Ordered, reviewed, and interpreted labs and EKG.  The pertinent results include:  CMP, CBC, respiratory panel unremarkable.  UDS positive for opiates and positive ethanol level EKG unremarkable.   Cardiac Monitoring:  The patient was maintained on a cardiac monitor.  I personally viewed and interpreted the cardiac monitored which showed an underlying rhythm of: NSR   Medicines ordered and prescription drug management:  I ordered medication including: Dilaudid 1 mg for pain with improvement.  Her pain  increased few hours later and administered another 1 mg Dilaudid. Reevaluation of the patient after these medicines showed that the patient improved I  have reviewed the patients home medicines and have made adjustments as needed  Consultations Obtained:  I requested consultation with TTS,  and discussed lab and imaging findings as well as pertinent plan - they recommend: Patient cleared psychologically.  However they recommend patient has her pain managed officially with the pain clinic instead of her PCP who resides in [place]  Discharge with outpatient follow-up disposition:  After consideration of the diagnostic results, physical exam, history and the patients response to treatment feel that the patent would benefit from discharge with outpatient followup.   Sacrum fracture, subsequent encounter: Patient's pain was well managed here in the emergency department.   TTS cleared psychologically and her declarations of SI earlier seem to be secondary to severe pain.  I have given patient pain management referral.  Daughter expresses concern that she will not be able to transport patient to and from her medical appointments.  Currently, patient lives at a living facility where she has help, however patient's daughter is primarily in charge of taking her to and from various appointments.  Daughter is concerned that she is get her herself attempting to do help patient whose mobility has significantly declined since her fracture.  I put in TTS consultation who will call her and attempt to determine if patient qualifies for additional home health aide to help with wheelchair medical transport.  All questions were asked and answered.  Patient and daughter were amenable to this plan.  Discharged home in good condition.   Final Clinical Impression(s) / ED Diagnoses Final diagnoses:  Closed fracture of sacrum and coccyx with routine healing, subsequent encounter    Rx / DC Orders ED Discharge Orders      None         Tonye Pearson, [place] 08/16/21 1707    Wyvonnia Dusky, MD 08/16/21 1744

## 2021-08-16 NOTE — ED Notes (Signed)
Report attempted with [facility] but no answer past the receptionist. x2

## 2021-08-16 NOTE — ED Notes (Addendum)
Pt brought in by [facility] fell 10+ days ago, dx'd with acute sacral fracture and multiple compression lumbar fractures (age undetermined) on 08/13/21, rx'd pain medication and back brace but denies wearing back brace at facility and states her pain medication is not managing her pain effectively.  EMS vitals: 152/88, hr77, resp24, sat 94% RA, [id number].  AOx3.  Pt reports to EMS conflicting details of last pain med admin - Pt states per EMS that she received pain med at either 10am this morning or not at all.

## 2021-08-16 NOTE — Discharge Instructions (Addendum)
Unfortunately does not seem that your current pain management regimen is working for you.  Given the complexity of managing your pain, I think it would be better for you to be managed by a pain clinic who specializes in this.  I provided you a referral here that you can give them a call.  I have also put in a consult to see if you qualify for additional home health for transport to and from pain medication appointments.

## 2021-08-16 NOTE — Care Management (Signed)
Pt initially presented for chronic pain. States she requires more pain medication. PCP is prescribing medications for her. She made a statement this AM regarding committing suicide if cannot get pain relief, She is being treated for depression, currently at [facility].  BH was consulted, and the patient was cleared for DC A consult was placed for TOC to look into wheelchair van for appts. / services. The patient or family should call their insurance company to see about coverage. There are some wheelchair vans that can be hired, listed in patient instructions

## 2021-08-16 NOTE — BH Assessment (Signed)
Comprehensive Clinical Assessment (CCA) Note  08/16/2021 Katherine Welch [id number]  Disposition:  Gave clinical disposition to B. Leevy-Johnson, NP, who determined that Pt does not meet inpatient criteria and is psych-cleared.    The patient demonstrates the following risk factors for suicide: Chronic risk factors for suicide include: psychiatric disorder of Depressive symptoms and chronic pain. Acute risk factors for suicide include: loss (financial, interpersonal, professional). Protective factors for this patient include: positive social support and positive therapeutic relationship. Considering these factors, the overall suicide risk at this point appears to be low. Patient is appropriate for outpatient follow up.   [place] [facility] from 08/16/2021 in [facility] ED from 08/13/2021 in [facility] from 08/05/2021 in [facility]  C-SSRS RISK CATEGORY Low Risk No Risk No Risk       Chief Complaint:  Chief Complaint  Patient presents with   Back Pain   Suicidal    Pt presented to ED due to back pain.  Per Pt's daughter, Pt made statements this morning to the effect that ''if I can't get this pain to stop, I'll kill myself.''  Pt has been trated for depression.   Visit Diagnosis: Depressive Disorder, NOS; Chronic Pain  Disposition:  Pt is a 76 year old female who presented to [facility] with complaint of physical pain (Pt has chronic back pain).  A TTS consult was made after Pt's daughter Katherine Welch stated that Pt said that if the pain could not be controlled, she would kill herself.  Pt made this statement this AM.  Pt lives at [facility] assisted living facility, and she is widowed (July 2021).  Pt is treated for depressive symptoms through medication prescribed by her PCP.  Pt also is prescribed opioids through her PCP.    Pt admitted that she made a suicidal statement today because of severe pain.   The pain is now diminished, and she does not feel suicidal.  Pt denied suicidal ideation, homicidal ideation, hallucination, self-injurious behavior, and substance use concerns.  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  Pt reported that she has been treated for depressive symptoms -- sadness and episodes of hypersomnia -- by medication prescribed through her PCP.  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  Pt denied past inpatient treatment or suicide attempt.  During assessment, Pt presented as alert and oriented.   She had good eye contact and was cooperative.  Pt's demeanor was calm.  Pt was groomed appropriately.  Mood was reported as sad.  Affect was responsive and full range.  Speech was normal in rate, rhythm, and volume.  Thought processes were within normal range, and thought content was logical and goal-oriented.  There was no evidence of delusion. Memory and concentration were intact. Insight, judgment, and impulse control were fair.   CCA Screening, Triage and Referral (STR)  Patient Reported Information How did you hear about [place]? Family/Friend  What Is the Reason for Your Visit/Call Today? Back pain; endorsed suicidal ideation this AM  How Long Has This Been Causing You Problems? <Week  What Do You Feel Would Help You the Most Today? Treatment for Depression or other mood problem; Medication(s) (Pain management)   Have You Recently Had Any Thoughts About Hurting Yourself? Yes  Are You Planning to Commit Suicide/Harm Yourself At This time? No   Have you Recently Had Thoughts About [place]? No  Are You Planning to  Harm Someone at This Time? No  Explanation: No data recorded  Have You Used Any Alcohol or Drugs in the Past 24 Hours? No  How Long Ago Did You Use Drugs or Alcohol? No data recorded What Did You Use and How Much? No data recorded  Do You Currently Have a Therapist/Psychiatrist? No  Name of  Therapist/Psychiatrist: No data recorded  Have You Been Recently Discharged From Any Office Practice or Programs? No  Explanation of Discharge From Practice/Program: No data recorded    CCA Screening Triage Referral Assessment Type of Contact: Tele-Assessment  Telemedicine Service Delivery: Telemedicine service delivery: This service was provided via telemedicine using a 2-way, interactive audio and video technology  Is this Initial or Reassessment? Initial Assessment  Date Telepsych consult ordered in [facility]:  08/16/21  Time Telepsych consult ordered in [facility]:  No data recorded Location of Assessment: Other (comment) (draw)  Provider Location: [facility]   Collateral Involvement: Daughter Katherine Welch   Does Patient Have a [place]? No data recorded Name and Contact of Legal Guardian: No data recorded If Minor and Not Living with Parent(s), Who has Custody? No data recorded Is CPS involved or ever been involved? Never  Is APS involved or ever been involved? Never   Patient Determined To Be At Risk for Harm To Self or Others Based on Review of Patient Reported Information or Presenting Complaint? No  Method: No data recorded Availability of Means: No data recorded Intent: No data recorded Notification Required: No data recorded Additional Information for Danger to Others Potential: No data recorded Additional Comments for Danger to Others Potential: No data recorded Are There Guns or Other Weapons in Your Home? No data recorded Types of Guns/Weapons: No data recorded Are These Weapons Safely Secured?                            No data recorded Who Could Verify You Are Able To Have These Secured: No data recorded Do You Have any Outstanding Charges, Pending Court Dates, Parole/Probation? No data recorded Contacted To Inform of Risk of Harm To Self or Others: No data recorded   Does Patient Present under Involuntary Commitment? No  IVC Papers  Initial File Date: No data recorded  [place] of Residence: [place]   Patient Currently Receiving the Following Services: Medication Management (through PCP)   Determination of Need: Urgent (48 hours)   Options For Referral: Medication Management; Outpatient Therapy; Other: Comment (Pain clinic)     CCA Biopsychosocial Patient Reported Schizophrenia/Schizoaffective Diagnosis in Past: No data recorded  Strengths: Supportive family   Mental Health Symptoms Depression:   Change in energy/activity; Sleep (too much or little)   Duration of Depressive symptoms:  Duration of Depressive Symptoms: Greater than two weeks   Mania:   None   Anxiety:    None   Psychosis:   None   Duration of Psychotic symptoms:    Trauma:  No data recorded  Obsessions:   None   Compulsions:   None   Inattention:   None   Hyperactivity/Impulsivity:   None   Oppositional/Defiant Behaviors:  No data recorded  Emotional Irregularity:   None   Other Mood/Personality Symptoms:  No data recorded   Mental Status Exam Appearance and self-care  Stature:   Average   Weight:   Average weight   Clothing:   Casual   Grooming:   Normal   Cosmetic use:   None  Posture/gait:   Normal   Motor activity:   Not Remarkable   Sensorium  Attention:   Normal   Concentration:   Normal   Orientation:   X5   Recall/memory:   Normal   Affect and Mood  Affect:   Appropriate   Mood:   Depressed   Relating  Eye contact:   Normal   Facial expression:   Responsive   Attitude toward examiner:   Cooperative   Thought and Language  Speech flow:  Clear and Coherent   Thought content:   Appropriate to Mood and Circumstances   Preoccupation:   None   Hallucinations:   None   Organization:  No data recorded  [employer] of Knowledge:   Average   Intelligence:   Average   Abstraction:   Normal   Judgement:   Fair   Reality Testing:    Adequate   Insight:   Fair   Decision Making:   Normal   Social Functioning  Social Maturity:   Responsible   Social Judgement:   Normal   Stress  Stressors:   Other (Comment) (Chronic pain)   Coping Ability:   Exhausted   Skill Deficits:   Activities of daily living   Supports:   Friends/Service system; Family     Religion:    Leisure/Recreation:    Exercise/Diet: Exercise/Diet Do You Have Any Trouble Sleeping?: Yes Explanation of Sleeping Difficulties: Per daughter, hypersomnia   CCA Employment/Education Employment/Work Situation:    Education:     CCA Family/Childhood History Family and Relationship History: Family history Marital status: Widowed Widowed, when?: July 2021 Does patient have children?: Yes How is patient's relationship with their children?: Adult daughter Babette [employer] helps  Childhood History:  Childhood History Did patient suffer any verbal/emotional/physical/sexual abuse as a child?: No Did patient suffer from severe childhood neglect?: No Was the patient ever a victim of a crime or a disaster?: No Witnessed domestic violence?: No Has patient been affected by domestic violence as an adult?: No  Child/Adolescent Assessment:     CCA Substance Use Alcohol/Drug Use: Alcohol / Drug Use Pain Medications: Pt takes opioids prescribed by her PCP for treatement of chronic pain; please see MAR Prescriptions: Please see MAR Over the Counter: Please see MAR History of alcohol / drug use?: No history of alcohol / drug abuse                         ASAM's:  Six Dimensions of Multidimensional Assessment  Dimension 1:  Acute Intoxication and/or Withdrawal Potential:      Dimension 2:  Biomedical Conditions and Complications:      Dimension 3:  Emotional, Behavioral, or Cognitive Conditions and Complications:     Dimension 4:  Readiness to Change:     Dimension 5:  Relapse, Continued use, or Continued Problem Potential:      Dimension 6:  Recovery/Living Environment:     ASAM Severity Score:    ASAM Recommended Level of Treatment:     Substance use Disorder (SUD)    Recommendations for Services/Supports/Treatments:    Discharge Disposition:    DSM5 Diagnoses: Patient Active Problem List   Diagnosis Date Noted   Adjustment disorder with physical complaints      Referrals to Alternative Service(s): Referred to Alternative Service(s):   Place:   Date:   Time:    Referred to Alternative Service(s):   Place:   Date:   Time:  Referred to Alternative Service(s):   Place:   Date:   Time:    Referred to Alternative Service(s):   Place:   Date:   Time:     Marlowe Aschoff, [facility]

## 2021-08-16 NOTE — ED Notes (Signed)
Pt has sacral back pain, rates it currently a 7/10 and has been on going since 1/29 when she fell. Stated that she fell and broke a bone in her sacral spine and was given pain medication. States that the pain rarely decreases below a 7/10. Currently has no new injury, pain or other complaint. Wants more/different medication for the pain.

## 2021-08-16 NOTE — ED Triage Notes (Signed)
Pt reports sacral pain unresolved by pain medication at home, radiating to bilateral hips.

## 2021-08-24 ENCOUNTER — Ambulatory Visit: Payer: Medicare Other | Admitting: Family

## 2021-11-03 ENCOUNTER — Emergency Department (HOSPITAL_COMMUNITY): Payer: Medicare Other

## 2021-11-03 ENCOUNTER — Other Ambulatory Visit: Payer: Self-pay

## 2021-11-03 ENCOUNTER — Inpatient Hospital Stay (HOSPITAL_COMMUNITY)
Admission: EM | Admit: 2021-11-03 | Discharge: 2021-11-06 | DRG: 683 | Disposition: A | Discharge status: Skilled Nursing Facility | Payer: Medicare Other | Source: Skilled Nursing Facility | Attending: Family Medicine | Admitting: Family Medicine

## 2021-11-03 ENCOUNTER — Observation Stay (HOSPITAL_COMMUNITY): Payer: Medicare Other

## 2021-11-03 ENCOUNTER — Encounter (HOSPITAL_COMMUNITY): Payer: Self-pay

## 2021-11-03 DIAGNOSIS — E876 Hypokalemia: Secondary | ICD-10-CM | POA: Diagnosis present

## 2021-11-03 DIAGNOSIS — B961 Klebsiella pneumoniae [K. pneumoniae] as the cause of diseases classified elsewhere: Secondary | ICD-10-CM | POA: Diagnosis present

## 2021-11-03 DIAGNOSIS — Z79899 Other long term (current) drug therapy: Secondary | ICD-10-CM

## 2021-11-03 DIAGNOSIS — Z20822 Contact with and (suspected) exposure to covid-19: Secondary | ICD-10-CM | POA: Diagnosis present

## 2021-11-03 DIAGNOSIS — Z880 Allergy status to penicillin: Secondary | ICD-10-CM

## 2021-11-03 DIAGNOSIS — K219 Gastro-esophageal reflux disease without esophagitis: Secondary | ICD-10-CM | POA: Diagnosis present

## 2021-11-03 DIAGNOSIS — F0393 Unspecified dementia, unspecified severity, with mood disturbance: Secondary | ICD-10-CM | POA: Diagnosis present

## 2021-11-03 DIAGNOSIS — R630 Anorexia: Secondary | ICD-10-CM | POA: Diagnosis present

## 2021-11-03 DIAGNOSIS — I4891 Unspecified atrial fibrillation: Secondary | ICD-10-CM | POA: Diagnosis not present

## 2021-11-03 DIAGNOSIS — I959 Hypotension, unspecified: Secondary | ICD-10-CM | POA: Diagnosis present

## 2021-11-03 DIAGNOSIS — E872 Acidosis, unspecified: Secondary | ICD-10-CM | POA: Diagnosis present

## 2021-11-03 DIAGNOSIS — K828 Other specified diseases of gallbladder: Secondary | ICD-10-CM | POA: Diagnosis present

## 2021-11-03 DIAGNOSIS — S32501D Unspecified fracture of right pubis, subsequent encounter for fracture with routine healing: Secondary | ICD-10-CM

## 2021-11-03 DIAGNOSIS — I10 Essential (primary) hypertension: Secondary | ICD-10-CM | POA: Diagnosis present

## 2021-11-03 DIAGNOSIS — Z6827 Body mass index (BMI) 27.0-27.9, adult: Secondary | ICD-10-CM

## 2021-11-03 DIAGNOSIS — N179 Acute kidney failure, unspecified: Secondary | ICD-10-CM | POA: Diagnosis not present

## 2021-11-03 DIAGNOSIS — X58XXXD Exposure to other specified factors, subsequent encounter: Secondary | ICD-10-CM | POA: Diagnosis present

## 2021-11-03 DIAGNOSIS — I480 Paroxysmal atrial fibrillation: Secondary | ICD-10-CM | POA: Diagnosis present

## 2021-11-03 DIAGNOSIS — Z888 Allergy status to other drugs, medicaments and biological substances status: Secondary | ICD-10-CM

## 2021-11-03 DIAGNOSIS — E785 Hyperlipidemia, unspecified: Secondary | ICD-10-CM | POA: Diagnosis present

## 2021-11-03 DIAGNOSIS — R296 Repeated falls: Secondary | ICD-10-CM | POA: Diagnosis present

## 2021-11-03 DIAGNOSIS — Z7401 Bed confinement status: Secondary | ICD-10-CM

## 2021-11-03 DIAGNOSIS — F329 Major depressive disorder, single episode, unspecified: Secondary | ICD-10-CM | POA: Diagnosis present

## 2021-11-03 DIAGNOSIS — E559 Vitamin D deficiency, unspecified: Secondary | ICD-10-CM | POA: Diagnosis present

## 2021-11-03 DIAGNOSIS — R1310 Dysphagia, unspecified: Secondary | ICD-10-CM | POA: Diagnosis present

## 2021-11-03 DIAGNOSIS — I248 Other forms of acute ischemic heart disease: Secondary | ICD-10-CM | POA: Diagnosis present

## 2021-11-03 DIAGNOSIS — K529 Noninfective gastroenteritis and colitis, unspecified: Secondary | ICD-10-CM | POA: Diagnosis present

## 2021-11-03 DIAGNOSIS — M21371 Foot drop, right foot: Secondary | ICD-10-CM | POA: Diagnosis present

## 2021-11-03 DIAGNOSIS — E860 Dehydration: Secondary | ICD-10-CM | POA: Diagnosis present

## 2021-11-03 DIAGNOSIS — N390 Urinary tract infection, site not specified: Secondary | ICD-10-CM | POA: Diagnosis present

## 2021-11-03 DIAGNOSIS — Z66 Do not resuscitate: Secondary | ICD-10-CM | POA: Diagnosis present

## 2021-11-03 LAB — I-STAT CHEM 8, ED
BUN: 29 mg/dL — ABNORMAL HIGH (ref 8–23)
Calcium, Ion: 0.89 mmol/L — CL (ref 1.15–1.40)
Chloride: 104 mmol/L (ref 98–111)
Creatinine, Ser: 1.7 mg/dL — ABNORMAL HIGH (ref 0.44–1.00)
Glucose, Bld: 100 mg/dL — ABNORMAL HIGH (ref 70–99)
HCT: 40 % (ref 36.0–46.0)
Hemoglobin: 13.6 g/dL (ref 12.0–15.0)
Potassium: 3 mmol/L — ABNORMAL LOW (ref 3.5–5.1)
Sodium: 138 mmol/L (ref 135–145)
TCO2: 17 mmol/L — ABNORMAL LOW (ref 22–32)

## 2021-11-03 LAB — CBC WITH DIFFERENTIAL/PLATELET
Abs Immature Granulocytes: 0.12 10*3/uL — ABNORMAL HIGH (ref 0.00–0.07)
Basophils Absolute: 0.1 10*3/uL (ref 0.0–0.1)
Basophils Relative: 1 %
Eosinophils Absolute: 0 10*3/uL (ref 0.0–0.5)
Eosinophils Relative: 0 %
HCT: 44.8 % (ref 36.0–46.0)
Hemoglobin: 14.9 g/dL (ref 12.0–15.0)
Immature Granulocytes: 1 %
Lymphocytes Relative: 13 %
Lymphs Abs: 1.5 10*3/uL (ref 0.7–4.0)
MCH: 29 pg (ref 26.0–34.0)
MCHC: 33.3 g/dL (ref 30.0–36.0)
MCV: 87.2 fL (ref 80.0–100.0)
Monocytes Absolute: 0.6 10*3/uL (ref 0.1–1.0)
Monocytes Relative: 6 %
Neutro Abs: 8.5 10*3/uL — ABNORMAL HIGH (ref 1.7–7.7)
Neutrophils Relative %: 79 %
Platelets: 319 10*3/uL (ref 150–400)
RBC: 5.14 MIL/uL — ABNORMAL HIGH (ref 3.87–5.11)
RDW: 13.8 % (ref 11.5–15.5)
WBC: 10.8 10*3/uL — ABNORMAL HIGH (ref 4.0–10.5)
nRBC: 0.3 % — ABNORMAL HIGH (ref 0.0–0.2)

## 2021-11-03 LAB — COMPREHENSIVE METABOLIC PANEL
ALT: 17 U/L (ref 0–44)
AST: 27 U/L (ref 15–41)
Albumin: 3.1 g/dL — ABNORMAL LOW (ref 3.5–5.0)
Alkaline Phosphatase: 84 U/L (ref 38–126)
Anion gap: 22 — ABNORMAL HIGH (ref 5–15)
BUN: 34 mg/dL — ABNORMAL HIGH (ref 8–23)
CO2: 21 mmol/L — ABNORMAL LOW (ref 22–32)
Calcium: 9.3 mg/dL (ref 8.9–10.3)
Chloride: 95 mmol/L — ABNORMAL LOW (ref 98–111)
Creatinine, Ser: 2.21 mg/dL — ABNORMAL HIGH (ref 0.44–1.00)
GFR, Estimated: 22 mL/min — ABNORMAL LOW (ref >60)
Glucose, Bld: 183 mg/dL — ABNORMAL HIGH (ref 70–99)
Potassium: 2.5 mmol/L — CL (ref 3.5–5.1)
Sodium: 138 mmol/L (ref 135–145)
Total Bilirubin: 1.7 mg/dL — ABNORMAL HIGH (ref 0.3–1.2)
Total Protein: 6.7 g/dL (ref 6.5–8.1)

## 2021-11-03 LAB — TYPE AND SCREEN
ABO/RH(D): O POS
Antibody Screen: NEGATIVE

## 2021-11-03 LAB — LACTIC ACID, PLASMA
Lactic Acid, Venous: 4.5 mmol/L (ref 0.5–1.9)
Lactic Acid, Venous: 1.7 mmol/L (ref 0.5–1.9)

## 2021-11-03 LAB — URINALYSIS, ROUTINE W REFLEX MICROSCOPIC
Bilirubin Urine: NEGATIVE
Glucose, UA: NEGATIVE mg/dL
Ketones, ur: 5 mg/dL — AB
Nitrite: NEGATIVE
Protein, ur: 30 mg/dL — AB
Specific Gravity, Urine: 1.012 (ref 1.005–1.030)
pH: 7 (ref 5.0–8.0)

## 2021-11-03 LAB — RESP PANEL BY RT-PCR (FLU A&B, COVID) ARPGX2
Influenza A by PCR: NEGATIVE
Influenza B by PCR: NEGATIVE
SARS Coronavirus 2 by RT PCR: NEGATIVE

## 2021-11-03 LAB — PROTIME-INR
INR: 1 (ref 0.8–1.2)
Prothrombin Time: 13.5 seconds (ref 11.4–15.2)

## 2021-11-03 LAB — ABO/RH: ABO/RH(D): O POS

## 2021-11-03 LAB — TROPONIN I (HIGH SENSITIVITY)
Troponin I (High Sensitivity): 24 ng/L — ABNORMAL HIGH (ref <18)
Troponin I (High Sensitivity): 25 ng/L — ABNORMAL HIGH (ref <18)

## 2021-11-03 LAB — LIPASE, BLOOD: Lipase: 53 U/L — ABNORMAL HIGH (ref 11–51)

## 2021-11-03 MED ORDER — HEPARIN (PORCINE) 25000 UT/250ML-% IV SOLN
1250.0000 [IU]/h | INTRAVENOUS | Status: DC
  Administered 2021-11-03: 1250 [IU]/h via INTRAVENOUS
  Filled 2021-11-03: qty 250

## 2021-11-03 MED ORDER — SODIUM CHLORIDE 0.9 % IV BOLUS
1000.0000 mL | Freq: Once | INTRAVENOUS | Status: AC
  Administered 2021-11-03: 1000 mL via INTRAVENOUS

## 2021-11-03 MED ORDER — SODIUM CHLORIDE 0.9 % IV BOLUS
500.0000 mL | Freq: Once | INTRAVENOUS | Status: AC
  Administered 2021-11-03: 500 mL via INTRAVENOUS

## 2021-11-03 MED ORDER — ATORVASTATIN CALCIUM 10 MG PO TABS
10.0000 mg | ORAL_TABLET | Freq: Every day | ORAL | Status: DC
Start: 2021-11-03 — End: 2021-11-06
  Administered 2021-11-03 – 2021-11-05 (×3): 10 mg via ORAL
  Filled 2021-11-03 (×3): qty 1

## 2021-11-03 MED ORDER — DULOXETINE HCL 60 MG PO CPEP
60.0000 mg | ORAL_CAPSULE | Freq: Every day | ORAL | Status: DC
  Administered 2021-11-03 – 2021-11-05 (×3): 60 mg via ORAL
  Filled 2021-11-03 (×3): qty 1

## 2021-11-03 MED ORDER — ACETAMINOPHEN 650 MG RE SUPP: 650.0000 mg | Freq: Four times a day (QID) | RECTAL | Status: DC | PRN

## 2021-11-03 MED ORDER — TRAZODONE HCL 100 MG PO TABS
100.0000 mg | ORAL_TABLET | Freq: Every evening | ORAL | Status: DC | PRN
  Administered 2021-11-05: 100 mg via ORAL
  Filled 2021-11-03: qty 1

## 2021-11-03 MED ORDER — ACETAMINOPHEN 325 MG PO TABS: 650.0000 mg | ORAL_TABLET | Freq: Four times a day (QID) | ORAL | Status: DC | PRN

## 2021-11-03 MED ORDER — FERROUS SULFATE 325 (65 FE) MG PO TABS
325.0000 mg | ORAL_TABLET | Freq: Every evening | ORAL | Status: DC
  Administered 2021-11-03 – 2021-11-05 (×3): 325 mg via ORAL
  Filled 2021-11-03 (×3): qty 1

## 2021-11-03 MED ORDER — CITALOPRAM HYDROBROMIDE 20 MG PO TABS
20.0000 mg | ORAL_TABLET | Freq: Every morning | ORAL | Status: DC
  Administered 2021-11-04 – 2021-11-06 (×3): 20 mg via ORAL
  Filled 2021-11-03 (×2): qty 1
  Filled 2021-11-03: qty 2

## 2021-11-03 MED ORDER — PANTOPRAZOLE SODIUM 40 MG PO TBEC
40.0000 mg | DELAYED_RELEASE_TABLET | Freq: Every day | ORAL | Status: DC
  Administered 2021-11-04 – 2021-11-06 (×3): 40 mg via ORAL
  Filled 2021-11-03 (×3): qty 1

## 2021-11-03 MED ORDER — ONDANSETRON HCL 4 MG/2ML IJ SOLN
4.0000 mg | Freq: Once | INTRAMUSCULAR | Status: AC
  Administered 2021-11-03: 4 mg via INTRAVENOUS
  Filled 2021-11-03: qty 2

## 2021-11-03 MED ORDER — HEPARIN BOLUS VIA INFUSION
4000.0000 [IU] | Freq: Once | INTRAVENOUS | Status: AC
  Administered 2021-11-03: 4000 [IU] via INTRAVENOUS
  Filled 2021-11-03: qty 4000

## 2021-11-03 MED ORDER — POTASSIUM CHLORIDE 10 MEQ/100ML IV SOLN
10.0000 meq | Freq: Once | INTRAVENOUS | Status: AC
  Administered 2021-11-03: 10 meq via INTRAVENOUS
  Filled 2021-11-03: qty 100

## 2021-11-03 MED ORDER — SODIUM CHLORIDE 0.9 % IV SOLN: Freq: Once | INTRAVENOUS | Status: AC

## 2021-11-03 MED ORDER — CALCITONIN (SALMON) 200 UNIT/ACT NA SOLN
1.0000 | Freq: Every day | NASAL | Status: DC
  Administered 2021-11-04 – 2021-11-06 (×3): 1 via NASAL
  Filled 2021-11-03 (×3): qty 3.7

## 2021-11-03 NOTE — ED Provider Notes (Signed)
?[facility] ?Provider Note ? ? ?CSN: [id number] ?Arrival date & time: 11/03/21  1611 ? ?  ? ?History ? ?Chief Complaint  ?Patient presents with  ? Hypotension  ? Abdominal Pain  ? ? ?Katherine Welch is a 76 y.o. female. ? ?76 year old female with prior medical history as detailed below [facility].  Patient reportedly with decreased p.o. intake for the last 2 to 3 days.  Patient with increased lethargy today.  Patient was noted to be tachycardic and hypotensive with initial EMS evaluation.  Patient complains of nausea.  She is without reported vomiting. ? ?Patient with dementia at baseline.  She is currently oriented at her baseline.  Patient's daughter is at bedside.  She confirms the patient is at her normal mental status.  Patient daughter confirms significant decreased p.o. intake over the last several days. ? ?Patient without prior history of atrial fibrillation or other arrhythmia.  Patient is not on anticoagulation. ? ?The history is provided by the patient and medical records.  ?Illness ?Location:  Nausea, decreased p.o. intake, tachycardia, hypotension ?Severity:  Moderate ?Onset quality:  Gradual ?Duration:  4 days ?Timing:  Constant ?Progression:  Worsening ?Chronicity:  New ? ?  ? ?Home Medications ?Prior to Admission medications   ?Medication Sig Start Date End Date Taking? Authorizing Provider  ?acetaminophen (TYLENOL) 500 MG tablet Take 500 mg by mouth every 8 (eight) hours as needed (pain).   Yes [provider]  ?atorvastatin (LIPITOR) 10 MG tablet Take 10 mg by mouth at bedtime.   Yes [provider]  ?bisoprolol-hydrochlorothiazide (ZIAC) 5-6.25 MG tablet Take 1 tablet by mouth every morning.   Yes [provider]  ?calcitonin, salmon, (MIACALCIN/FORTICAL) 200 UNIT/ACT nasal spray Place 1 spray into alternate nostrils daily.   Yes [provider]  ?celecoxib (CELEBREX) 200 MG capsule Take 200 mg  by mouth 2 (two) times daily. 8am, 5pm   Yes [provider]  ?Cholecalciferol (VITAMIN D3) 50 MCG (2000 UT) capsule Take 2,000 Units by mouth every morning.   Yes [provider]  ?citalopram (CELEXA) 20 MG tablet Take 20 mg by mouth every morning.   Yes [provider]  ?DULoxetine (CYMBALTA) 60 MG capsule Take 60 mg by mouth at bedtime.   Yes [provider]  ?ferrous sulfate 325 (65 FE) MG tablet Take 325 mg by mouth every evening.   Yes [provider]  ?fexofenadine (ALLEGRA) 180 MG tablet Take 180 mg by mouth every morning.   Yes [provider]  ?HYDROcodone-acetaminophen (NORCO/VICODIN) 5-325 MG tablet Take 2 tablets by mouth every 4 (four) hours as needed. 08/13/21  Yes Kommor, [place], MD  ?lidocaine 4 % Place 1 patch onto the skin daily.   Yes [provider]  ?losartan (COZAAR) 50 MG tablet Take 50 mg by mouth daily.   Yes [provider]  ?nystatin (MYCOSTATIN/NYSTOP) powder Apply 1 application. topically 3 (three) times daily.   Yes [provider]  ?omeprazole (PRILOSEC) 20 MG capsule Take 20 mg by mouth daily before breakfast.   Yes [provider]  ?ondansetron (ZOFRAN) 4 MG tablet Take 4 mg by mouth every 8 (eight) hours as needed for nausea or vomiting.   Yes [provider]  ?risedronate (ACTONEL) 35 MG tablet Take 35 mg by mouth every 7 (seven) days. with water on empty stomach, nothing by mouth or lie down for next 30 minutes.   Yes [provider]  ?tiZANidine (  ZANAFLEX) 4 MG tablet Take 4 mg by mouth every 8 (eight) hours as needed for muscle spasms.   Yes [provider]  ?traZODone (DESYREL) 100 MG tablet Take 100 mg by mouth at bedtime as needed for sleep.   Yes [provider]  ?fluticasone (FLONASE) 50 MCG/ACT nasal spray Place 1 spray into both nostrils daily as needed (seasonal allergies).    [provider]  ?   ? ?Allergies    ?Lisinopril, Biaxin  [clarithromycin], and Penicillins   ? ?Review of Systems   ?Review of Systems  ?All other systems reviewed and are negative. ? ?Physical Exam ?Updated Vital Signs ?BP 104/62   Pulse (!) 104   Temp (!) 97.3 ?F (36.3 ?C)   Resp 18   SpO2 100%  ?Physical Exam ?Vitals and nursing note reviewed.  ?Constitutional:   ?   General: She is not in acute distress. ?   Appearance: Normal appearance. She is well-developed.  ?HENT:  ?   Head: Normocephalic and atraumatic.  ?   Mouth/Throat:  ?   Comments: Dry mucous membranes ?Eyes:  ?   Conjunctiva/sclera: Conjunctivae normal.  ?   Pupils: Pupils are equal, round, and reactive to light.  ?Cardiovascular:  ?   Rate and Rhythm: Tachycardia present. Rhythm irregular.  ?   Heart sounds: Normal heart sounds.  ?Pulmonary:  ?   Effort: Pulmonary effort is normal. No respiratory distress.  ?   Breath sounds: Normal breath sounds.  ?Abdominal:  ?   General: There is no distension.  ?   Palpations: Abdomen is soft.  ?   Tenderness: There is no abdominal tenderness.  ?Musculoskeletal:     ?   General: No deformity. Normal range of motion.  ?   Cervical back: Normal range of motion and neck supple.  ?Skin: ?   General: Skin is warm and dry.  ?Neurological:  ?   General: No focal deficit present.  ?   Mental Status: She is alert.  ?   Comments: Alert, answering questions and is at baseline mental status  ? ? ?ED Results / Procedures / Treatments   ?Labs ?(all labs ordered are listed, but only abnormal results are displayed) ?Labs Reviewed  ?CBC WITH DIFFERENTIAL/PLATELET - Abnormal; Notable for the following components:  ?    Result Value  ? WBC 10.8 (*)   ? RBC 5.14 (*)   ? nRBC 0.3 (*)   ? Neutro Abs 8.5 (*)   ? Abs Immature Granulocytes 0.12 (*)   ? All other components within normal limits  ?COMPREHENSIVE METABOLIC PANEL - Abnormal; Notable for the following components:  ? Potassium 2.5 (*)   ? Chloride 95 (*)   ? CO2 21 (*)   ? Glucose, Bld 183 (*)   ? BUN 34 (*)   ? Creatinine, Ser  2.21 (*)   ? Albumin 3.1 (*)   ? Total Bilirubin 1.7 (*)   ? GFR, Estimated 22 (*)   ? Anion gap 22 (*)   ? All other components within normal limits  ?LIPASE, BLOOD - Abnormal; Notable for the following components:  ? Lipase 53 (*)   ? All other components within normal limits  ?LACTIC ACID, PLASMA - Abnormal; Notable for the following components:  ? Lactic Acid, Venous 4.5 (*)   ? All other components within normal limits  ?TROPONIN I (HIGH SENSITIVITY) - Abnormal; Notable for the following components:  ? Troponin I (High Sensitivity) 24 (*)   ?  All other components within normal limits  ?TROPONIN I (HIGH SENSITIVITY) - Abnormal; Notable for the following components:  ? Troponin I (High Sensitivity) 25 (*)   ? All other components within normal limits  ?RESP PANEL BY RT-PCR (FLU A&B, COVID) [id number]  ?CULTURE, BLOOD (ROUTINE X 2)  ?CULTURE, BLOOD (ROUTINE X 2)  ?PROTIME-INR  ?URINALYSIS, ROUTINE W REFLEX MICROSCOPIC  ?LACTIC ACID, PLASMA  ?I-STAT CHEM 8, ED  ?TYPE AND SCREEN  ?ABO/RH  ? ? ?EKG ?EKG Interpretation ? ?Date/Time:  Tuesday November 03 2021 18:16:53 EDT ?Ventricular Rate:  111 ?PR Interval:    ?QRS Duration: 84 ?QT Interval:  382 ?QTC Calculation: 520 ?R Axis:   -3 ?Text Interpretation: Atrial fibrillation Nonspecific repol abnormality, diffuse leads Prolonged QT interval Confirmed by Dene Gentry [phone]) on 11/03/2021 6:21:59 PM ? ?Radiology ?DG Chest Port 1 View ? ?Result Date: 11/03/2021 ?CLINICAL DATA:  Chest pain EXAM: PORTABLE CHEST 1 VIEW COMPARISON:  Lumbar spine evaluation of August 13, 2021. FINDINGS: EKG leads project over the chest. Cardiomediastinal contours and hilar structures are grossly normal on portable AP projection. No signs of lobar level consolidation. Healed rib fractures along both LEFT and RIGHT chest involving multiple ribs. No signs of pneumothorax or evidence of pleural effusion on frontal radiograph. Signs of cement augmentation of the T12 vertebral body not well evaluated.  IMPRESSION: 1. Healed rib fractures of multiple levels on the LEFT and RIGHT. No definite acute rib fracture. 2. No signs of pneumothorax or pleural effusion. 3. No signs of lobar consolidation. Electronically Si

## 2021-11-03 NOTE — ED Notes (Signed)
Admitting provider at bedside to evaluate patient and update her and family at bedside on plan of care.  

## 2021-11-03 NOTE — ED Triage Notes (Signed)
Pt arrives via EMS from [facility] [address]. Staff at facility called ems for patient being lethargic. Upon ems arrival pt was very hypotensive. Pt c/o abdominal pain for the past couple of days. Pt is oriented to name, dob, and location. ? ?

## 2021-11-03 NOTE — H&P (Addendum)
?History and Physical  ? ? ?[name] SPQ:[id number] DOB: 03-30-1946 DOA: 11/03/2021 ? ?PCP: Florentina Jenny, MD  ?Patient coming from: Assisted living facility. ? ?Chief Complaint: Nausea vomiting. ? ?History obtained from patient's daughter, patient, ER physician and previous records. ? ?HPI: Katherine Welch is a 76 y.o. female with history of hypertension, depression, hyperlipidemia, frequent falls, prior history of polio with right-sided foot drop who has been largely bedbound for the last 3 to 4 months after patient had a sacral fracture was brought to the ER after patient had nausea vomiting.  As per the patient's daughter patient has been having poor appetite and not eating well for the last month since patient has been having frequent episodes of nausea when patient tries to eat.  Patient at times complains of abdominal discomfort.  Did not have any diarrhea.  Did not have any chest pain or shortness of breath or palpitations.  Today patient had multiple episodes of vomiting prior to coming to the hospital. ? ?ED Course: In the ER patient was hypotensive and also was found to be in A-fib with RVR.  Patient's labs showed creatinine of 2.2 which increased from February when it was 0.9.  Patient's lactic acid was 4.5.  Patient responded well to fluids with blood pressure improving above 100 systolic.  Heart rate also improved.  Patient remained nauseous.  CT abdomen pelvis showed gallbladder distention with sludge but no inflammatory changes.  Ultrasound of the abdomen also showed gallbladder sludge but no interval changes.  Abdomen on exam appeared benign.  Patient admitted for acute renal failure with persistent nausea and vomiting.  While in the ER when patient was attempted to have diet patient vomited. ? ?Review of Systems: As per HPI, rest all negative. ? ? ?Past Medical History:  ?Diagnosis Date  ? Chronic hip pain   ? GERD (gastroesophageal reflux disease)   ? Hypertension   ? Major depressive  disorder   ? Polio   ? Vitamin D deficiency   ? ? ?History reviewed. No pertinent surgical history. ? ? reports that she has never smoked. She has never been exposed to tobacco smoke. She has never used smokeless tobacco. She reports that she does not currently use alcohol. She reports that she does not use drugs. ? ?Allergies  ?Allergen Reactions  ? Lisinopril Cough  ? Biaxin [Clarithromycin] Rash  ? Penicillins Rash  ? ? ?History reviewed. No pertinent family history. ? ?Prior to Admission medications   ?Medication Sig Start Date End Date Taking? Authorizing Provider  ?acetaminophen (TYLENOL) 500 MG tablet Take 500 mg by mouth every 8 (eight) hours as needed (pain).   Yes [provider]  ?atorvastatin (LIPITOR) 10 MG tablet Take 10 mg by mouth at bedtime.   Yes [provider]  ?bisoprolol-hydrochlorothiazide (ZIAC) 5-6.25 MG tablet Take 1 tablet by mouth every morning.   Yes [provider]  ?calcitonin, salmon, (MIACALCIN/FORTICAL) 200 UNIT/ACT nasal spray Place 1 spray into alternate nostrils daily.   Yes [provider]  ?celecoxib (CELEBREX) 200 MG capsule Take 200 mg by mouth 2 (two) times daily. 8am, 5pm   Yes [provider]  ?Cholecalciferol (VITAMIN D3) 50 MCG (2000 UT) capsule Take 2,000 Units by mouth every morning.   Yes [provider]  ?citalopram (CELEXA) 20 MG tablet Take 20 mg by mouth every morning.   Yes [provider]  ?DULoxetine (CYMBALTA) 60 MG capsule Take 60 mg by mouth at bedtime.   Yes [provider]  ?ferrous sulfate 325 (65 FE) MG tablet Take 325 mg by mouth every evening.   Yes [provider]  ?fexofenadine (ALLEGRA) 180 MG tablet Take 180 mg by mouth every morning.   Yes [provider]  ?HYDROcodone-acetaminophen (NORCO/VICODIN) 5-325 MG tablet Take 2 tablets by mouth every 4 (four) hours as needed. 08/13/21  Yes Kommor, [place], MD  ?lidocaine 4 % Place 1 patch onto the skin daily.   Yes  [provider]  ?losartan (COZAAR) 50 MG tablet Take 50 mg by mouth daily.   Yes [provider]  ?nystatin (MYCOSTATIN/NYSTOP) powder Apply 1 application. topically 3 (three) times daily.   Yes [provider]  ?omeprazole (PRILOSEC) 20 MG capsule Take 20 mg by mouth daily before breakfast.   Yes [provider]  ?ondansetron (ZOFRAN) 4 MG tablet Take 4 mg by mouth every 8 (eight) hours as needed for nausea or vomiting.   Yes [provider]  ?risedronate (ACTONEL) 35 MG tablet Take 35 mg by mouth every 7 (seven) days. with water on empty stomach, nothing by mouth or lie down for next 30 minutes.   Yes [provider]  ?tiZANidine (ZANAFLEX) 4 MG tablet Take 4 mg by mouth every 8 (eight) hours as needed for muscle spasms.   Yes [provider]  ?traZODone (DESYREL) 100 MG tablet Take 100 mg by mouth at bedtime as needed for sleep.   Yes [provider]  ?fluticasone (FLONASE) 50 MCG/ACT nasal spray Place 1 spray into both nostrils daily as needed (seasonal allergies).    [provider]  ? ? ?Physical Exam: ?Constitutional: Moderately built and nourished. ?Vitals:  ? 11/03/21 2030 11/03/21 2130 11/03/21 2136 11/03/21 2137  ?BP: 111/75 104/82    ?Pulse: (!) 113 (!) 36 (!) 111   ?Resp: 19 (!) 23 19 14   ?Temp:      ?SpO2: 100% 93% 100%   ? ?Eyes: Anicteric no pallor. ?ENMT: No discharge from the ears eyes nose and mouth. ?Neck: No mass felt.  No neck rigidity. ?Respiratory: No rhonchi or crepitations. ?Cardiovascular: S1-S2 heard. ?Abdomen: Soft nontender bowel sounds present. ?Musculoskeletal: No edema. ?Skin: No rash. ?Neurologic: Alert awake oriented time place and person.  Moving all extremities. ?Psychiatric: Appears normal.  Normal affect. ? ? ?Labs on Admission: I have personally reviewed following labs and imaging studies ? ?CBC: ?Recent Labs  ?Lab 11/03/21 ?1622 11/03/21 ?2119  ?WBC 10.8*  --   ?NEUTROABS 8.5*  --   ?HGB 14.9  13.6  ?HCT 44.8 40.0  ?MCV 87.2  --   ?PLT 319  --   ? ?Basic Metabolic Panel: ?Recent Labs  ?Lab 11/03/21 ?1622 11/03/21 ?2119  ?NA 138 138  ?K 2.5* 3.0*  ?CL 95* 104  ?CO2 21*  --   ?GLUCOSE 183* 100*  ?BUN 34* 29*  ?CREATININE 2.21* 1.70*  ?CALCIUM 9.3  --   ? ?GFR: ?CrCl cannot be calculated (Unknown ideal weight.). ?Liver Function Tests: ?Recent Labs  ?Lab 11/03/21 ?1622  ?AST 27  ?ALT 17  ?ALKPHOS 84  ?BILITOT 1.7*  ?PROT 6.7  ?ALBUMIN 3.1*  ? ?Recent Labs  ?Lab 11/03/21 ?1622  ?LIPASE 53*  ? ?No results for input(s): AMMONIA in the last 168 hours. ?Coagulation Profile: ?Recent Labs  ?Lab 11/03/21 ?1622  ?INR 1.0  ? ?Cardiac Enzymes: ?No results for input(s): CKTOTAL, CKMB, CKMBINDEX, TROPONINI in the last 168 hours. ?BNP (last 3 results) ?No results for input(s): PROBNP in the last 8760 hours. ?HbA1C: ?  No results for input(s): HGBA1C in the last 72 hours. ?CBG: ?No results for input(s): GLUCAP in the last 168 hours. ?Lipid Profile: ?No results for input(s): CHOL, HDL, LDLCALC, TRIG, CHOLHDL, LDLDIRECT in the last 72 hours. ?Thyroid Function Tests: ?No results for input(s): TSH, T4TOTAL, FREET4, T3FREE, THYROIDAB in the last 72 hours. ?Anemia Panel: ?No results for input(s): VITAMINB12, FOLATE, FERRITIN, TIBC, IRON, RETICCTPCT in the last 72 hours. ?Urine analysis: ?No results found for: COLORURINE, APPEARANCEUR, LABSPEC, PHURINE, GLUCOSEU, HGBUR, BILIRUBINUR, KETONESUR, PROTEINUR, UROBILINOGEN, NITRITE, LEUKOCYTESUR ?Sepsis Labs: ?@LABRCNTIP (procalcitonin:4,lacticidven:4) ?) ?Recent Results (from the past 240 hour(s))  ?Resp Panel by RT-PCR (Flu A&B, Covid) Nasopharyngeal Swab     Status: None  ? Collection Time: 11/03/21  4:22 PM  ? Specimen: Nasopharyngeal Swab; Nasopharyngeal(NP) swabs in vial transport medium  ?Result Value Ref Range Status  ? SARS Coronavirus 2 by RT PCR NEGATIVE NEGATIVE Final  ?  Comment: (NOTE) ?SARS-CoV-2 target nucleic acids are NOT DETECTED. ? ?The SARS-CoV-2 RNA is generally  detectable in upper respiratory ?specimens during the acute phase of infection. The lowest ?concentration of SARS-CoV-2 viral copies this assay can detect is ?138 copies/mL. A negative result does not preclude S

## 2021-11-03 NOTE — ED Notes (Signed)
CRITICAL VALUE STICKER ? ?CRITICAL VALUE: lactic acid 4.5 ? ?RECEIVER (on-site recipient of call):I. Wood Novacek ? ?DATE & TIME NOTIFIED: 11/03/21 1837 ? ?MESSENGER (representative from lab):lab ? ?MD NOTIFIED: Francia Greaves ? ?TIME OF NOTIFICATION: [id number] ?RESPONSE:   ?

## 2021-11-03 NOTE — ED Notes (Signed)
CRITICAL VALUE STICKER ? ?CRITICAL VALUE: k+ 2.5 ? ?RECEIVER (on-site recipient of call):I. Kamarion Zagami ? ?DATE & TIME NOTIFIED: 11/03/21 1845 ? ?MESSENGER (representative from lab):lab ? ?MD NOTIFIED: Rodena Medin ? ?TIME OF NOTIFICATION:1845 ? ?RESPONSE:   ?

## 2021-11-03 NOTE — Progress Notes (Signed)
ANTICOAGULATION CONSULT NOTE - Initial Consult ? ?Pharmacy Consult for heparin ?Indication: atrial fibrillation ? ?Allergies  ?Allergen Reactions  ? Lisinopril Cough  ? Biaxin [Clarithromycin] Rash  ? Penicillins Rash  ? ? ?Patient Measurements: ?  ?Heparin Dosing Weight: 83 kg  ? ?Vital Signs: ?Temp: 97.3 ?F (36.3 ?C) (04/25 1622) ?BP: 104/82 (04/25 2130) ?Pulse Rate: 111 (04/25 2136) ? ?Labs: ?Recent Labs  ?  11/03/21 ?1622 11/03/21 ?1714 11/03/21 ?2119  ?HGB 14.9  --  13.6  ?HCT 44.8  --  40.0  ?PLT 319  --   --   ?LABPROT 13.5  --   --   ?INR 1.0  --   --   ?CREATININE 2.21*  --  1.70*  ?TROPONINIHS 24* 25*  --   ? ? ?CrCl cannot be calculated (Unknown ideal weight.). ? ? ?Medical History: ?Past Medical History:  ?Diagnosis Date  ? Chronic hip pain   ? GERD (gastroesophageal reflux disease)   ? Hypertension   ? Major depressive disorder   ? Polio   ? Vitamin D deficiency   ? ? ?Medications:  ?(Not in a hospital admission)  ? ?Assessment: ?1 YOF with h/o Afib not on anticoagulation. Pharmacy consulted to start IV heparin. H/H and Plt wnl, SCr mildly elevated  ? ?Goal of Therapy:  ?Heparin level 0.3-0.7 units/ml ?Monitor platelets by anticoagulation protocol: Yes ?  ?Plan:  ?-Heparin 4000 units IV bolus followed by heparin infusion at 1250 units/hr ?-F/u 8 hr HL ?-Monitor daily HL, CBC and watch for s/s of bleeding  ? ?Albertina Parr, PharmD., BCCCP ?Clinical Pharmacist ?Please refer to [facility] for unit-specific pharmacist  ? ? ? ?

## 2021-11-04 ENCOUNTER — Observation Stay (HOSPITAL_COMMUNITY): Payer: Medicare Other

## 2021-11-04 DIAGNOSIS — Z79899 Other long term (current) drug therapy: Secondary | ICD-10-CM | POA: Diagnosis not present

## 2021-11-04 DIAGNOSIS — I959 Hypotension, unspecified: Secondary | ICD-10-CM | POA: Diagnosis present

## 2021-11-04 DIAGNOSIS — S32501D Unspecified fracture of right pubis, subsequent encounter for fracture with routine healing: Secondary | ICD-10-CM | POA: Diagnosis not present

## 2021-11-04 DIAGNOSIS — E860 Dehydration: Secondary | ICD-10-CM | POA: Diagnosis present

## 2021-11-04 DIAGNOSIS — I10 Essential (primary) hypertension: Secondary | ICD-10-CM

## 2021-11-04 DIAGNOSIS — F0393 Unspecified dementia, unspecified severity, with mood disturbance: Secondary | ICD-10-CM | POA: Diagnosis present

## 2021-11-04 DIAGNOSIS — N390 Urinary tract infection, site not specified: Secondary | ICD-10-CM | POA: Diagnosis present

## 2021-11-04 DIAGNOSIS — N179 Acute kidney failure, unspecified: Secondary | ICD-10-CM | POA: Diagnosis not present

## 2021-11-04 DIAGNOSIS — R630 Anorexia: Secondary | ICD-10-CM | POA: Diagnosis present

## 2021-11-04 DIAGNOSIS — Z7401 Bed confinement status: Secondary | ICD-10-CM | POA: Diagnosis not present

## 2021-11-04 DIAGNOSIS — I248 Other forms of acute ischemic heart disease: Secondary | ICD-10-CM | POA: Diagnosis present

## 2021-11-04 DIAGNOSIS — K219 Gastro-esophageal reflux disease without esophagitis: Secondary | ICD-10-CM | POA: Diagnosis present

## 2021-11-04 DIAGNOSIS — E785 Hyperlipidemia, unspecified: Secondary | ICD-10-CM | POA: Diagnosis present

## 2021-11-04 DIAGNOSIS — Z66 Do not resuscitate: Secondary | ICD-10-CM | POA: Diagnosis present

## 2021-11-04 DIAGNOSIS — E872 Acidosis, unspecified: Secondary | ICD-10-CM | POA: Diagnosis present

## 2021-11-04 DIAGNOSIS — R296 Repeated falls: Secondary | ICD-10-CM | POA: Diagnosis present

## 2021-11-04 DIAGNOSIS — B961 Klebsiella pneumoniae [K. pneumoniae] as the cause of diseases classified elsewhere: Secondary | ICD-10-CM | POA: Diagnosis present

## 2021-11-04 DIAGNOSIS — E876 Hypokalemia: Secondary | ICD-10-CM | POA: Diagnosis present

## 2021-11-04 DIAGNOSIS — I4891 Unspecified atrial fibrillation: Secondary | ICD-10-CM | POA: Diagnosis not present

## 2021-11-04 DIAGNOSIS — Z880 Allergy status to penicillin: Secondary | ICD-10-CM | POA: Diagnosis not present

## 2021-11-04 DIAGNOSIS — Z888 Allergy status to other drugs, medicaments and biological substances status: Secondary | ICD-10-CM | POA: Diagnosis not present

## 2021-11-04 DIAGNOSIS — E559 Vitamin D deficiency, unspecified: Secondary | ICD-10-CM | POA: Diagnosis present

## 2021-11-04 DIAGNOSIS — F329 Major depressive disorder, single episode, unspecified: Secondary | ICD-10-CM | POA: Diagnosis present

## 2021-11-04 DIAGNOSIS — I480 Paroxysmal atrial fibrillation: Secondary | ICD-10-CM | POA: Diagnosis present

## 2021-11-04 DIAGNOSIS — Z20822 Contact with and (suspected) exposure to covid-19: Secondary | ICD-10-CM | POA: Diagnosis present

## 2021-11-04 DIAGNOSIS — X58XXXD Exposure to other specified factors, subsequent encounter: Secondary | ICD-10-CM | POA: Diagnosis present

## 2021-11-04 LAB — BASIC METABOLIC PANEL
Anion gap: 13 (ref 5–15)
Anion gap: 10 (ref 5–15)
BUN: 28 mg/dL — ABNORMAL HIGH (ref 8–23)
BUN: 21 mg/dL (ref 8–23)
CO2: 21 mmol/L — ABNORMAL LOW (ref 22–32)
CO2: 24 mmol/L (ref 22–32)
Calcium: 7.9 mg/dL — ABNORMAL LOW (ref 8.9–10.3)
Calcium: 7.7 mg/dL — ABNORMAL LOW (ref 8.9–10.3)
Chloride: 106 mmol/L (ref 98–111)
Chloride: 104 mmol/L (ref 98–111)
Creatinine, Ser: 1.87 mg/dL — ABNORMAL HIGH (ref 0.44–1.00)
Creatinine, Ser: 1.52 mg/dL — ABNORMAL HIGH (ref 0.44–1.00)
GFR, Estimated: 27 mL/min — ABNORMAL LOW (ref >60)
GFR, Estimated: 35 mL/min — ABNORMAL LOW (ref >60)
Glucose, Bld: 74 mg/dL (ref 70–99)
Glucose, Bld: 118 mg/dL — ABNORMAL HIGH (ref 70–99)
Potassium: 2.7 mmol/L — CL (ref 3.5–5.1)
Potassium: 2 mmol/L — CL (ref 3.5–5.1)
Sodium: 140 mmol/L (ref 135–145)
Sodium: 138 mmol/L (ref 135–145)

## 2021-11-04 LAB — ECHOCARDIOGRAM COMPLETE
AR max vel: 3.25 cm2
AV Peak grad: 2.7 mmHg
Ao pk vel: 0.81 m/s
Area-P 1/2: 3.6 cm2
Calc EF: 61.2 %
Height: 65 in
S' Lateral: 2.9 cm
Single Plane A2C EF: 60.1 %
Single Plane A4C EF: 61.9 %
Weight: 2640 oz

## 2021-11-04 LAB — CBC WITH DIFFERENTIAL/PLATELET
Abs Immature Granulocytes: 0.11 10*3/uL — ABNORMAL HIGH (ref 0.00–0.07)
Basophils Absolute: 0.1 10*3/uL (ref 0.0–0.1)
Basophils Relative: 1 %
Eosinophils Absolute: 0 10*3/uL (ref 0.0–0.5)
Eosinophils Relative: 0 %
HCT: 37.4 % (ref 36.0–46.0)
Hemoglobin: 12.2 g/dL (ref 12.0–15.0)
Immature Granulocytes: 1 %
Lymphocytes Relative: 14 %
Lymphs Abs: 1.6 10*3/uL (ref 0.7–4.0)
MCH: 28.6 pg (ref 26.0–34.0)
MCHC: 32.6 g/dL (ref 30.0–36.0)
MCV: 87.8 fL (ref 80.0–100.0)
Monocytes Absolute: 0.7 10*3/uL (ref 0.1–1.0)
Monocytes Relative: 6 %
Neutro Abs: 8.5 10*3/uL — ABNORMAL HIGH (ref 1.7–7.7)
Neutrophils Relative %: 78 %
Platelets: 218 10*3/uL (ref 150–400)
RBC: 4.26 MIL/uL (ref 3.87–5.11)
RDW: 14.2 % (ref 11.5–15.5)
WBC: 11 10*3/uL — ABNORMAL HIGH (ref 4.0–10.5)
nRBC: 0 % (ref 0.0–0.2)

## 2021-11-04 LAB — HEPATIC FUNCTION PANEL
ALT: 15 U/L (ref 0–44)
AST: 21 U/L (ref 15–41)
Albumin: 2.3 g/dL — ABNORMAL LOW (ref 3.5–5.0)
Alkaline Phosphatase: 61 U/L (ref 38–126)
Bilirubin, Direct: 0.2 mg/dL (ref 0.0–0.2)
Indirect Bilirubin: 1.5 mg/dL — ABNORMAL HIGH (ref 0.3–0.9)
Total Bilirubin: 1.7 mg/dL — ABNORMAL HIGH (ref 0.3–1.2)
Total Protein: 4.9 g/dL — ABNORMAL LOW (ref 6.5–8.1)

## 2021-11-04 LAB — CBG MONITORING, ED
Glucose-Capillary: 62 mg/dL — ABNORMAL LOW (ref 70–99)
Glucose-Capillary: 152 mg/dL — ABNORMAL HIGH (ref 70–99)
Glucose-Capillary: 232 mg/dL — ABNORMAL HIGH (ref 70–99)
Glucose-Capillary: 209 mg/dL — ABNORMAL HIGH (ref 70–99)

## 2021-11-04 LAB — HEPARIN LEVEL (UNFRACTIONATED)
Heparin Unfractionated: >1.1 IU/mL — ABNORMAL HIGH (ref 0.30–0.70)
Heparin Unfractionated: >1.1 IU/mL — ABNORMAL HIGH (ref 0.30–0.70)
Heparin Unfractionated: >1.1 IU/mL — ABNORMAL HIGH (ref 0.30–0.70)

## 2021-11-04 LAB — GLUCOSE, CAPILLARY
Glucose-Capillary: 116 mg/dL — ABNORMAL HIGH (ref 70–99)
Glucose-Capillary: 137 mg/dL — ABNORMAL HIGH (ref 70–99)

## 2021-11-04 LAB — TSH: TSH: 1.69 u[IU]/mL (ref 0.350–4.500)

## 2021-11-04 LAB — MAGNESIUM: Magnesium: 0.8 mg/dL — CL (ref 1.7–2.4)

## 2021-11-04 LAB — TROPONIN I (HIGH SENSITIVITY)
Troponin I (High Sensitivity): 237 ng/L (ref <18)
Troponin I (High Sensitivity): 174 ng/L (ref <18)

## 2021-11-04 LAB — LIPASE, BLOOD: Lipase: 41 U/L (ref 11–51)

## 2021-11-04 MED ORDER — PROCHLORPERAZINE EDISYLATE 10 MG/2ML IJ SOLN: 10.0000 mg | Freq: Four times a day (QID) | INTRAMUSCULAR | Status: DC | PRN

## 2021-11-04 MED ORDER — DEXTROSE 50 % IV SOLN
1.0000 | Freq: Once | INTRAVENOUS | Status: AC
  Administered 2021-11-04: 50 mL via INTRAVENOUS
  Filled 2021-11-04: qty 50

## 2021-11-04 MED ORDER — ONDANSETRON HCL 4 MG/2ML IJ SOLN
4.0000 mg | Freq: Four times a day (QID) | INTRAMUSCULAR | Status: DC | PRN
  Administered 2021-11-04 – 2021-11-06 (×4): 4 mg via INTRAVENOUS
  Filled 2021-11-04 (×3): qty 2

## 2021-11-04 MED ORDER — ONDANSETRON HCL 4 MG/2ML IJ SOLN
INTRAMUSCULAR | Status: AC
  Filled 2021-11-04: qty 2

## 2021-11-04 MED ORDER — ONDANSETRON HCL 4 MG/2ML IJ SOLN
4.0000 mg | Freq: Once | INTRAMUSCULAR | Status: AC
  Administered 2021-11-04: 4 mg via INTRAVENOUS
  Filled 2021-11-04: qty 2

## 2021-11-04 MED ORDER — MAGNESIUM SULFATE 4 GM/100ML IV SOLN
4.0000 g | Freq: Once | INTRAVENOUS | Status: AC
Start: 2021-11-04 — End: 2021-11-04
  Administered 2021-11-04: 4 g via INTRAVENOUS
  Filled 2021-11-04 (×2): qty 100

## 2021-11-04 MED ORDER — HEPARIN (PORCINE) 25000 UT/250ML-% IV SOLN
700.0000 [IU]/h | INTRAVENOUS | Status: DC
  Administered 2021-11-05: 700 [IU]/h via INTRAVENOUS

## 2021-11-04 MED ORDER — POTASSIUM CHLORIDE 2 MEQ/ML IV SOLN
INTRAVENOUS | Status: DC
Start: 2021-11-04 — End: 2021-11-04
  Filled 2021-11-04: qty 1000

## 2021-11-04 MED ORDER — SODIUM CHLORIDE 0.9 % IV SOLN: INTRAVENOUS | Status: DC

## 2021-11-04 MED ORDER — POTASSIUM CHLORIDE CRYS ER 20 MEQ PO TBCR: 40.0000 meq | EXTENDED_RELEASE_TABLET | Freq: Once | ORAL | Status: DC

## 2021-11-04 MED ORDER — CEFTRIAXONE SODIUM 1 G IJ SOLR
1.0000 g | INTRAMUSCULAR | Status: DC
Start: 2021-11-04 — End: 2021-11-06
  Administered 2021-11-04 – 2021-11-06 (×3): 1 g via INTRAVENOUS
  Filled 2021-11-04 (×3): qty 10

## 2021-11-04 MED ORDER — DEXTROSE IN LACTATED RINGERS 5 % IV SOLN: INTRAVENOUS | Status: DC

## 2021-11-04 MED ORDER — KCL IN DEXTROSE-NACL 40-5-0.45 MEQ/L-%-% IV SOLN
INTRAVENOUS | Status: DC
  Filled 2021-11-04 (×3): qty 1000

## 2021-11-04 MED ORDER — POTASSIUM CHLORIDE 10 MEQ/100ML IV SOLN
10.0000 meq | INTRAVENOUS | Status: AC
  Administered 2021-11-04 – 2021-11-05 (×6): 10 meq via INTRAVENOUS
  Filled 2021-11-04 (×6): qty 100

## 2021-11-04 MED ORDER — HEPARIN (PORCINE) 25000 UT/250ML-% IV SOLN
1000.0000 [IU]/h | INTRAVENOUS | Status: DC
  Administered 2021-11-04 (×2): 1000 [IU]/h via INTRAVENOUS
  Filled 2021-11-04: qty 250

## 2021-11-04 NOTE — Evaluation (Signed)
Occupational Therapy Evaluation ?Patient Details ?Name: Katherine Welch ?MRN: [id number] ?DOB: 1946-02-17 ?Today's Date: 11/04/2021 ? ? ?History of Present Illness Pt is a 76 y/o female admitted secondary to nausea/vomiting. Found to have a fib and ARF. PMH includes sacral fx, depression, dementia.  ? ?Clinical Impression ?  ?Patient admitted for above and presents with problem list below, including generalized weakness, decreased activity tolerance, nausea.  Patient is a poor historian with hx of dementia, reports here visiting her daughter but per chart from ALF; also reports using rollator at baseline and independent with ADLs but per chart mostly bed bound since pelvic fx.  She is disoriented to time, follows simple commands but presents with decreased awareness, problem solving. Pt currently requires total assist +2 for toileting, min-mod assist +2 for rolling in bed and mod assist for UB dressing. Based on performance today, [facility] if [facility] can provide necessary assist (if not will need SNF).  Will follow acutely.  ?   ? ?Recommendations for follow up therapy are one component of a multi-disciplinary discharge planning process, led by the attending physician.  Recommendations may be updated based on patient status, additional functional criteria and insurance authorization.  ? ?Follow Up Recommendations ? Home health OT (if ALF can provide required level of care)  ?  ?Assistance Recommended at Discharge Frequent or constant Supervision/Assistance  ?Patient can return home with the following Two people to help with walking and/or transfers;Two people to help with bathing/dressing/bathroom;Assistance with cooking/housework;Direct supervision/assist for medications management;Direct supervision/assist for financial management;Assist for transportation;Help with stairs or ramp for entrance ? ?  ?Functional Status Assessment ? Patient has had a recent decline in their  functional status and demonstrates the ability to make significant improvements in function in a reasonable and predictable amount of time.  ?Equipment Recommendations ? Other (comment) (defer)  ?  ?Recommendations for Other Services   ? ? ?  ?Precautions / Restrictions Precautions ?Precautions: Fall ?Restrictions ?Weight Bearing Restrictions: No  ? ?  ? ?Mobility Bed Mobility ?Overal bed mobility: Needs Assistance ?Bed Mobility: Rolling ?Rolling: Min assist, Mod assist ?  ?  ?  ?  ?General bed mobility comments: Pt with BM so session focused on clean up as pt reporting to limit mobility. Min to mod A to roll back and forth for clean up and linen change. ?  ? ?Transfers ?  ?  ?  ?  ?  ?  ?  ?  ?  ?  ?  ? ?  ?Balance   ?  ?  ?  ?  ?  ?  ?  ?  ?  ?  ?  ?  ?  ?  ?  ?  ?  ?  ?   ? ?ADL either performed or assessed with clinical judgement  ? ?ADL Overall ADL's : Needs assistance/impaired ?  ?  ?Grooming: Set up;Bed level ?  ?  ?  ?  ?  ?Upper Body Dressing : Moderate assistance;Bed level ?  ?Lower Body Dressing: Total assistance;+2 for physical assistance;+2 for safety/equipment;Bed level ?  ?  ?Toilet Transfer Details (indicate cue type and reason): deferred ?Toileting- Clothing Manipulation and Hygiene: Total assistance;+2 for physical assistance;+2 for safety/equipment;Bed level ?Toileting - Clothing Manipulation Details (indicate cue type and reason): incontinent of bladder/bowel, no awareness ?  ?  ?Functional mobility during ADLs: Moderate assistance;Minimal assistance (limited to rolling) ?   ? ? ? ?Vision   ?Vision Assessment?:  No apparent visual deficits  ?   ?Perception   ?  ?Praxis   ?  ? ?Pertinent Vitals/Pain Pain Assessment ?Pain Assessment: Faces ?Faces Pain Scale: Hurts little more ?Pain Location: generalized ?Pain Descriptors / Indicators: Grimacing, Guarding ?Pain Intervention(s): Limited activity within patient's tolerance, Monitored during session, Repositioned  ? ? ? ?Hand Dominance   ?   ?Extremity/Trunk Assessment Upper Extremity Assessment ?Upper Extremity Assessment: Generalized weakness ?  ?Lower Extremity Assessment ?Lower Extremity Assessment: Defer to PT evaluation ?  ?Cervical / Trunk Assessment ?Cervical / Trunk Assessment: Kyphotic ?  ?Communication Communication ?Communication: No difficulties ?  ?Cognition Arousal/Alertness: Awake/alert ?Behavior During Therapy: [facility] for tasks assessed/performed ?Overall Cognitive Status: Impaired/Different from baseline ?Area of Impairment: Orientation, Following commands, Awareness, Problem solving ?  ?  ?  ?  ?  ?  ?  ?  ?Orientation Level: Disoriented to, Time ?  ?  ?Following Commands: Follows one step commands consistently, Follows one step commands with increased time ?  ?Awareness: Intellectual ?Problem Solving: Slow processing, Decreased initiation, Difficulty sequencing, Requires verbal cues ?General Comments: pt disoriented to time, reports she is from outside [place] and is visiting her daugther with no recall of living at [facility].  Pt follow simple commands, no awareness of incontient of BM. ?  ?  ?General Comments    ? ?  ?Exercises   ?  ?Shoulder Instructions    ? ? ?Home Living Family/patient expects to be discharged to:: Assisted living ?  ?  ?  ?  ?  ?  ?  ?  ?  ?  ?  ?  ?  ?  ?Home Equipment: Rollator (4 wheels);Cane - single point ?  ?  ?  ? ?  ?Prior Functioning/Environment Prior Level of Function : Needs assist;Patient poor historian/Family not available ?  ?  ?  ?  ?  ?  ?Mobility Comments: Reports using cane/walker for mobility, however, per notes, has recently been bedbound. ?ADLs Comments: Pt reports independence, but unsure of accuracy ?  ? ?  ?  ?OT Problem List: Decreased strength;Decreased activity tolerance;Decreased cognition;Impaired balance (sitting and/or standing);Decreased safety awareness;Decreased knowledge of use of DME or AE;Decreased knowledge of precautions ?  ?   ?OT Treatment/Interventions:  Self-care/ADL training;DME and/or AE instruction;Therapeutic exercise;Therapeutic activities;Patient/family education;Balance training  ?  ?OT Goals(Current goals can be found in the care plan section) Acute Rehab OT Goals ?Patient Stated Goal: none stated ?Time For Goal Achievement: 11/18/21 ?Potential to Achieve Goals: Fair  ?OT Frequency: Min 2X/week ?  ? ?Co-evaluation PT/OT/SLP Co-Evaluation/Treatment: Yes ?Reason for Co-Treatment: For patient/therapist safety;To address functional/ADL transfers ?PT goals addressed during session: Mobility/safety with mobility;Balance ?OT goals addressed during session: ADL's and self-care ?  ? ?  ?AM-PAC OT "6 Clicks" Daily Activity     ?Outcome Measure Help from another person eating meals?: A Little ?Help from another person taking care of personal grooming?: A Little ?Help from another person toileting, which includes using toliet, bedpan, or urinal?: Total ?Help from another person bathing (including washing, rinsing, drying)?: A Lot ?Help from another person to put on and taking off regular upper body clothing?: A Lot ?Help from another person to put on and taking off regular lower body clothing?: Total ?6 Click Score: 12 ?  ?End of Session Nurse Communication: Mobility status ? ?Activity Tolerance: Patient tolerated treatment well ?Patient left: in bed;with call bell/phone within reach ? ?OT Visit Diagnosis: Other abnormalities of gait and mobility (R26.89);Muscle weakness (generalized) (M62.81);Other symptoms and  signs involving cognitive function  ?              ?Time: 9470-9628 ?OT Time Calculation (min): 23 min ?Charges:  OT General Charges ?$OT Visit: 1 Visit ?OT Evaluation ?$OT Eval Moderate Complexity: 1 Mod ? ?Barry Brunner, OT ?Acute Rehabilitation Services ?Pager [phone] ?Office [phone] ? ? ?Chancy Milroy ?11/04/2021, 12:51 PM ?

## 2021-11-04 NOTE — Progress Notes (Signed)
Critical levels  ?Potassium 2.0 ?Troponin 174 ?Information given to [place] ?

## 2021-11-04 NOTE — Progress Notes (Signed)
Heparin infusion off from 1530-1615. Due to loss of IV access. Changed to another IV site and continued. Spoke to pharmacist and heparin level still elevated and she will reorder another heparin level to recheck.  ?

## 2021-11-04 NOTE — Evaluation (Signed)
Physical Therapy Evaluation ?Patient Details ?Name: Katherine Welch ?MRN: [id number] ?DOB: 1945-08-11 ?Today's Date: 11/04/2021 ? ?History of Present Illness ? Pt is a 76 y/o female admitted secondary to nausea/vomiting. Found to have a fib and ARF. PMH includes sacral fx, depression, dementia.  ?Clinical Impression ? Pt admitted secondary to problem above with deficits below. Mobility limited to bed mobility for clean up as pt not feeling well. Required min to mod A to roll from side to side. Pt currently at ALF per notes, but reports to PT/OT she lives at home. Pt also reports she ambulates, however, per notes has been mostly bed bound at ALF since pelvic fx. Recommending return to ALF with PT follow up if ALF can provide necessary assist. If not, recommend SNF. Will continue to follow acutely.    ?   ? ?Recommendations for follow up therapy are one component of a multi-disciplinary discharge planning process, led by the attending physician.  Recommendations may be updated based on patient status, additional functional criteria and insurance authorization. ? ?Follow Up Recommendations Home health PT (at ALF if staff can provide necessary assist; if they cannot, recommend SNF) ? ?  ?Assistance Recommended at Discharge Frequent or constant Supervision/Assistance  ?Patient can return home with the following ? A lot of help with walking and/or transfers;A lot of help with bathing/dressing/bathroom;Assistance with cooking/housework;Help with stairs or ramp for entrance;Direct supervision/assist for financial management;Assist for transportation;Direct supervision/assist for medications management ? ?  ?Equipment Recommendations Wheelchair cushion (measurements PT);Wheelchair (measurements PT)  ?Recommendations for Other Services ?    ?  ?Functional Status Assessment Patient has had a recent decline in their functional status and demonstrates the ability to make significant improvements in function in a reasonable and  predictable amount of time.  ? ?  ?Precautions / Restrictions Precautions ?Precautions: Fall ?Restrictions ?Weight Bearing Restrictions: No  ? ?  ? ?Mobility ? Bed Mobility ?Overal bed mobility: Needs Assistance ?Bed Mobility: Rolling ?Rolling: Min assist, Mod assist ?  ?  ?  ?  ?General bed mobility comments: Pt with BM so session focused on clean up as pt reporting to limit mobility. Min to mod A to roll back and forth for clean up and linen change. ?  ? ?Transfers ?  ?  ?  ?  ?  ?  ?  ?  ?  ?  ?  ? ?Ambulation/Gait ?  ?  ?  ?  ?  ?  ?  ?  ? ?Stairs ?  ?  ?  ?  ?  ? ?Wheelchair Mobility ?  ? ?Modified Rankin (Stroke Patients Only) ?  ? ?  ? ?Balance   ?  ?  ?  ?  ?  ?  ?  ?  ?  ?  ?  ?  ?  ?  ?  ?  ?  ?  ?   ? ? ? ?Pertinent Vitals/Pain Pain Assessment ?Pain Assessment: Faces ?Faces Pain Scale: Hurts little more ?Pain Location: generalized ?Pain Descriptors / Indicators: Grimacing, Guarding ?Pain Intervention(s): Monitored during session, Limited activity within patient's tolerance, Repositioned  ? ? ?Home Living Family/patient expects to be discharged to:: Assisted living ?  ?  ?  ?  ?  ?  ?  ?  ?Home Equipment: Rollator (4 wheels);Cane - single point ?   ?  ?Prior Function Prior Level of Function : Needs assist ?  ?  ?  ?  ?  ?  ?Mobility Comments: Reports using  cane/walker for mobility, however, per notes, has recently been bedbound. ?ADLs Comments: Pt reports independence, but unsure of accuracy ?  ? ? ?Hand Dominance  ?   ? ?  ?Extremity/Trunk Assessment  ? Upper Extremity Assessment ?Upper Extremity Assessment: Defer to OT evaluation ?  ? ?Lower Extremity Assessment ?Lower Extremity Assessment: Generalized weakness (pelvis fx ~3 months ago) ?  ? ?Cervical / Trunk Assessment ?Cervical / Trunk Assessment: Kyphotic  ?Communication  ? Communication: No difficulties  ?Cognition Arousal/Alertness: Awake/alert ?Behavior During Therapy: [facility] for tasks assessed/performed ?Overall Cognitive Status: No family/caregiver  present to determine baseline cognitive functioning ?  ?  ?  ?  ?  ?  ?  ?  ?  ?  ?  ?  ?  ?  ?  ?  ?General Comments: Increased confusion noted. Did not [facility]. Does have hx of dementia but unsure of baseline. ?  ?  ? ?  ?General Comments   ? ?  ?Exercises    ? ?Assessment/Plan  ?  ?PT Assessment Patient needs continued PT services  ?PT Problem List Decreased strength;Decreased balance;Decreased activity tolerance;Decreased mobility;Decreased knowledge of use of DME;Decreased knowledge of precautions;Decreased safety awareness;Decreased cognition ? ?   ?  ?PT Treatment Interventions DME instruction;Gait training;Functional mobility training;Therapeutic activities;Therapeutic exercise;Balance training;Patient/family education   ? ?PT Goals (Current goals can be found in the Care Plan section)  ?Acute Rehab PT Goals ?Patient Stated Goal: to go home ?PT Goal Formulation: With patient ?Time For Goal Achievement: 11/18/21 ?Potential to Achieve Goals: Good ? ?  ?Frequency Min 2X/week ?  ? ? ?Co-evaluation PT/OT/SLP Co-Evaluation/Treatment: Yes ?Reason for Co-Treatment: For patient/therapist safety;To address functional/ADL transfers ?PT goals addressed during session: Mobility/safety with mobility;Balance ?  ?  ? ? ?  ?AM-PAC PT "6 Clicks" Mobility  ?Outcome Measure Help needed turning from your back to your side while in a flat bed without using bedrails?: A Lot ?Help needed moving from lying on your back to sitting on the side of a flat bed without using bedrails?: A Lot ?Help needed moving to and from a bed to a chair (including a wheelchair)?: Total ?Help needed standing up from a chair using your arms (e.g., wheelchair or bedside chair)?: Total ?Help needed to walk in hospital room?: Total ?Help needed climbing 3-5 steps with a railing? : Total ?6 Click Score: 8 ? ?  ?End of Session   ?Activity Tolerance: Patient limited by fatigue;Treatment limited secondary to medical complications  (Comment) (nausea) ?Patient left: in bed;with call bell/phone within reach (on stretcher in ED) ?Nurse Communication: Mobility status;Other (comment) (pt with dark BM) ?PT Visit Diagnosis: Unsteadiness on feet (R26.81);Muscle weakness (generalized) (M62.81);Difficulty in walking, not elsewhere classified (R26.2) ?  ? ?Time: 0973-5329 ?PT Time Calculation (min) (ACUTE ONLY): 23 min ? ? ?Charges:   PT Evaluation ?$PT Eval Moderate Complexity: 1 Mod ?  ?  ?   ? ? ?Farley Ly, PT, DPT  ?Acute Rehabilitation Services  ?Pager: [phone] ?Office: [phone] ? ? ?[place] S Genifer Lazenby ?11/04/2021, 12:33 PM ?

## 2021-11-04 NOTE — Progress Notes (Signed)
ANTICOAGULATION CONSULT NOTE ? ?Pharmacy Consult for heparin ?Indication: atrial fibrillation ? ?Allergies  ?Allergen Reactions  ? Lisinopril Cough  ? Biaxin [Clarithromycin] Rash  ? Penicillins Rash  ? ? ?Patient Measurements: ?Height: 5\' 5"  (165.1 cm) ?Weight: 74.8 kg (165 lb) (estimate per pt) ?IBW/kg (Calculated) : 57 ?Heparin Dosing Weight: 83 kg  ? ?Vital Signs: ?Temp: 98.4 ?F (36.9 ?C) (04/26 1537) ?Temp Source: Oral (04/26 1537) ?BP: 109/66 (04/26 1537) ?Pulse Rate: 65 (04/26 1537) ? ?Labs: ?Recent Labs  ?  11/03/21 ?1622 11/03/21 ?1714 11/03/21 ?2119 11/04/21 ?0603 11/04/21 ?0759 11/04/21 ?1745  ?HGB 14.9  --  13.6 12.2  --   --   ?HCT 44.8  --  40.0 37.4  --   --   ?PLT 319  --   --  218  --   --   ?LABPROT 13.5  --   --   --   --   --   ?INR 1.0  --   --   --   --   --   ?HEPARINUNFRC  --   --   --   --  >1.10* >1.10*  ?CREATININE 2.21*  --  1.70* 1.87*  --   --   ?TROPONINIHS 24* 25*  --  237*  --   --   ? ? ?Estimated Creatinine Clearance: 25.5 mL/min (A) (by C-G formula based on SCr of 1.87 mg/dL (H)). ? ?Assessment: ?61 YOF with h/o Afib not on anticoagulation. Pharmacy consulted to start IV heparin.  ? ?Initial heparin level is very high. RN reports it was drawn correctly. No bleeding noted. ? ?Recheck heparin level remains high. IV infiltrated and heparin was off per RN x1 hour. Heparin was restarted ~16:15. Per Phlebotomy were not told of infiltrated arm and worked with IV team to draw from infiltrated arm. Finally able to obtain repeat level but it remains high again despite per phlebotomy report the blood being thick and difficult to obtain enough for levels.  ? ?Goal of Therapy:  ?Heparin level 0.3-0.7 units/ml ?Monitor platelets by anticoagulation protocol: Yes ?  ?Plan:  ?Hold heparin x 1 hour ?Reduce to 700 units/hr on restart.  ?Check an 8 hr heparin level ?Daily heparin level and CBC ? ?62, PharmD ?Clinical Pharmacist ?Please refer to [facility] for [facility] numbers ?11/04/2021  6:47 PM ? ? ? ? ?

## 2021-11-04 NOTE — ED Notes (Signed)
Unable to get no blood return.x2 ?

## 2021-11-04 NOTE — Progress Notes (Signed)
?Progress Note ? ?Patient: Katherine BillingLaura Jeanne Meine ZOX:[id number] DOB: [birth year]  ?DOA: 11/03/2021  DOS: 11/04/2021  ?  ?Brief hospital course: ?Katherine Welch is a 76 y.o. female with a history of post-polio right foot drop, falls, HTN, HLD, depression, and recent pelvic fractures causing her to be increasingly bedbound the past few months. She presented to the ED 4/25 with nausea, poor oral intake, an episode of vomiting, and found to be hypotensive with AFib with RVR (new diagnosis). Lactic acid elevated to 4.5 and creatinine elevated from 0.9 to 2.2. BP responded to IV fluids and she's since converted to NSR. CT abdomen pelvis showed gallbladder distention with sludge but no inflammatory changes.  Ultrasound of the abdomen also showed gallbladder sludge but no interval changes.  Abdomen on exam appeared benign.  Patient admitted for acute renal failure with persistent nausea and vomiting. ? ?Assessment and Plan: ?Intractable nausea and vomiting: Unclear etiology. Acuity suggests gastroenteritis. Abd is benign, CT abd/pelvis without definite source.  ?- Monitor clinically with antiemetic and analgesic support.  ?- If not improving or worsening, would need to pursue further work up, e.g. MRCP. For now we'll try liquid diet.  ? ?Acute renal failure: Improved modestly since arrival with IVF. Suspect may have element of ATN due to hypotension. No significant renal abnormality on CT. No casts mentioned on urine micro. Calcium oxalate noted without RBCs.  ?- Avoid nephrotoxins, continue IV fluids, monitor BMP. ? ?UTI:  ?- Continue ceftriaxone (tolerating this despite PCN allergy) pending culture. ? ?Troponin elevation: No chest pain. Suspect this is due to the transient AFib with RVR which has resolved.  ?- Recheck troponin to confirm it has peaked. Do not currently suspect ACS.  ?- Check echo ? ?Paroxysmal atrial fibrillation with RVR: Converted back to NSR. TSH wnl at 1.690.  ?- Continue IV heparin for now. Will discuss  with patient and daughter going forward, though her risk for, and significant history of, falling would suggest risk of anticoagulation in this setting may be prohibitive long term.  ?- Supp K, Mg as below ?- Once BP stabilized, can restart home beta blocker, though HR has been down into 60's. ? ?Hypokalemia, hypomagnesemia: Severe and refractory to supplementation.  ?- Continue supplementation and recheck this PM ? ?Lactic acidosis: Due to hypotension which is due to dehydration.  ?- Resolved with IVF. ? ?Frequent falls:  ?- PT/OT ? ?Pubic bone fractures: Subacute as well as vertebral compression fractures. ?- Pain control.  ? ?HLD:  ?- Continue statin ? ?Depression: Quiescent ?- Continue home meds ? ?Subjective: Feels much better than when she got here. Still very weak and had another episode of emesis overnight with po attempt. No abd pain or change in bowel habits. No chest pain or dyspnea.  ? ?Objective: ?Vitals:  ? 11/04/21 1230 11/04/21 1315 11/04/21 1400 11/04/21 1445  ?BP: (!) 103/51 (!) 92/45 (!) 101/45 (!) 108/53  ?Pulse: 65 68 66 66  ?Resp: 15 18 17 17   ?Temp:      ?TempSrc:      ?SpO2: 100% 100% 100% 100%  ?Weight:      ?Height:      ? ?Gen: Elderly female in no distress ?Pulm: Nonlabored breathing room air. Clear ?CV: Regular rate and rhythm. No murmur, rub, or gallop. No JVD, no dependent edema. ?GI: Abdomen soft, non-tender, non-distended, with normoactive bowel sounds.  ?Ext: Warm, no deformities. Pain with LE ROM. ?Skin: No acute rashes, lesions or ulcers on visualized skin. ?Neuro: Alert and oriented.  No focal neurological deficits. ?Psych: Judgement and insight appear fair. Mood euthymic & affect congruent. Behavior is appropriate.   ? ?Data Personally reviewed: ?CBC: ?Recent Labs  ?Lab 11/03/21 ?1622 11/03/21 ?2119 11/04/21 ?0603  ?WBC 10.8*  --  11.0*  ?NEUTROABS 8.5*  --  8.5*  ?HGB 14.9 13.6 12.2  ?HCT 44.8 40.0 37.4  ?MCV 87.2  --  87.8  ?PLT 319  --  218  ? ?Basic Metabolic Panel: ?Recent  Labs  ?Lab 11/03/21 ?1622 11/03/21 ?2119 11/04/21 ?0603  ?NA 138 138 140  ?K 2.5* 3.0* 2.7*  ?CL 95* 104 106  ?CO2 21*  --  21*  ?GLUCOSE 183* 100* 74  ?BUN 34* 29* 28*  ?CREATININE 2.21* 1.70* 1.87*  ?CALCIUM 9.3  --  7.9*  ?MG  --   --  0.8*  ? ?GFR: ?Estimated Creatinine Clearance: 25.5 mL/min (A) (by C-G formula based on SCr of 1.87 mg/dL (H)). ?Liver Function Tests: ?Recent Labs  ?Lab 11/03/21 ?1622 11/04/21 ?0603  ?AST 27 21  ?ALT 17 15  ?ALKPHOS 84 61  ?BILITOT 1.7* 1.7*  ?PROT 6.7 4.9*  ?ALBUMIN 3.1* 2.3*  ? ?Recent Labs  ?Lab 11/03/21 ?1622 11/04/21 ?0603  ?LIPASE 53* 41  ? ?No results for input(s): AMMONIA in the last 168 hours. ?Coagulation Profile: ?Recent Labs  ?Lab 11/03/21 ?1622  ?INR 1.0  ? ?Cardiac Enzymes: ?No results for input(s): CKTOTAL, CKMB, CKMBINDEX, TROPONINI in the last 168 hours. ?BNP (last 3 results) ?No results for input(s): PROBNP in the last 8760 hours. ?HbA1C: ?No results for input(s): HGBA1C in the last 72 hours. ?CBG: ?Recent Labs  ?Lab 11/04/21 ?9735 11/04/21 ?0657 11/04/21 ?0751 11/04/21 ?1200  ?GLUCAP 62* 152* 232* 209*  ? ?Lipid Profile: ?No results for input(s): CHOL, HDL, LDLCALC, TRIG, CHOLHDL, LDLDIRECT in the last 72 hours. ?Thyroid Function Tests: ?Recent Labs  ?  11/03/21 ?2334  ?TSH 1.690  ? ?Anemia Panel: ?No results for input(s): VITAMINB12, FOLATE, FERRITIN, TIBC, IRON, RETICCTPCT in the last 72 hours. ?Urine analysis: ?   ?Component Value Date/Time  ? COLORURINE AMBER (A) 11/03/2021 2207  ? APPEARANCEUR CLOUDY (A) 11/03/2021 2207  ? LABSPEC 1.012 11/03/2021 2207  ? PHURINE 7.0 11/03/2021 2207  ? GLUCOSEU NEGATIVE 11/03/2021 2207  ? HGBUR LARGE (A) 11/03/2021 2207  ? BILIRUBINUR NEGATIVE 11/03/2021 2207  ? KETONESUR 5 (A) 11/03/2021 2207  ? PROTEINUR 30 (A) 11/03/2021 2207  ? NITRITE NEGATIVE 11/03/2021 2207  ? LEUKOCYTESUR MODERATE (A) 11/03/2021 2207  ? ?Recent Results (from the past 240 hour(s))  ?Resp Panel by RT-PCR (Flu A&B, Covid) Nasopharyngeal Swab      Status: None  ? Collection Time: 11/03/21  4:22 PM  ? Specimen: Nasopharyngeal Swab; Nasopharyngeal(NP) swabs in vial transport medium  ?Result Value Ref Range Status  ? SARS Coronavirus 2 by RT PCR NEGATIVE NEGATIVE Final  ?  Comment: (NOTE) ?SARS-CoV-2 target nucleic acids are NOT DETECTED. ? ?The SARS-CoV-2 RNA is generally detectable in upper respiratory ?specimens during the acute phase of infection. The lowest ?concentration of SARS-CoV-2 viral copies this assay can detect is ?138 copies/mL. A negative result does not preclude SARS-Cov-2 ?infection and should not be used as the sole basis for treatment or ?other patient management decisions. A negative result may occur with  ?improper specimen collection/handling, submission of specimen other ?than nasopharyngeal swab, presence of viral mutation(s) within the ?areas targeted by this assay, and inadequate number of viral ?copies(<138 copies/mL). A negative result must be combined with ?clinical observations, patient history,  and epidemiological ?information. The expected result is Negative. ? ?Fact Sheet for Patients:  ?BloggerCourse.com ? ?Fact Sheet for Healthcare Providers:  ?SeriousBroker.it ? ?This test is no t yet approved or cleared by the [place] FDA and  ?has been authorized for detection and/or diagnosis of SARS-CoV-2 by ?FDA under an Emergency Use Authorization (EUA). This EUA will remain  ?in effect (meaning this test can be used) for the duration of the ?COVID-19 declaration under Section 564(b)(1) of the Act, 21 ?U.S.C.section 360bbb-3(b)(1), unless the authorization is terminated  ?or revoked sooner.  ? ? ?  ? Influenza A by PCR NEGATIVE NEGATIVE Final  ? Influenza B by PCR NEGATIVE NEGATIVE Final  ?  Comment: (NOTE) ?The Xpert Xpress SARS-CoV-2/FLU/RSV plus assay is intended as an aid ?in the diagnosis of influenza from Nasopharyngeal swab specimens and ?should not be used as a sole basis  for treatment. Nasal washings and ?aspirates are unacceptable for Xpert Xpress SARS-CoV-2/FLU/RSV ?testing. ? ?Fact Sheet for Patients: ?BloggerCourse.com ? ?Fact Sheet for Healthcare

## 2021-11-04 NOTE — ED Notes (Signed)
CBG of 62. RN notified.  ?

## 2021-11-04 NOTE — ED Notes (Signed)
Md paged to report critical values ?

## 2021-11-04 NOTE — Progress Notes (Signed)
ANTICOAGULATION CONSULT NOTE ? ?Pharmacy Consult for heparin ?Indication: atrial fibrillation ? ?Allergies  ?Allergen Reactions  ? Lisinopril Cough  ? Biaxin [Clarithromycin] Rash  ? Penicillins Rash  ? ? ?Patient Measurements: ?Height: 5\' 5"  (165.1 cm) ?Weight: 74.8 kg (165 lb) (estimate per pt) ?IBW/kg (Calculated) : 57 ?Heparin Dosing Weight: 83 kg  ? ?Vital Signs: ?Temp: 97.6 ?F (36.4 ?C) (04/25 2248) ?Temp Source: Oral (04/25 2248) ?BP: 114/59 (04/26 0830) ?Pulse Rate: 70 (04/26 0830) ? ?Labs: ?Recent Labs  ?  11/03/21 ?1622 11/03/21 ?1714 11/03/21 ?2119 11/04/21 ?0603 11/04/21 ?0759  ?HGB 14.9  --  13.6 12.2  --   ?HCT 44.8  --  40.0 37.4  --   ?PLT 319  --   --  218  --   ?LABPROT 13.5  --   --   --   --   ?INR 1.0  --   --   --   --   ?HEPARINUNFRC  --   --   --   --  >1.10*  ?CREATININE 2.21*  --  1.70* 1.87*  --   ?TROPONINIHS 24* 25*  --  237*  --   ? ? ? ?Estimated Creatinine Clearance: 25.5 mL/min (A) (by C-G formula based on SCr of 1.87 mg/dL (H)). ? ?Assessment: ?1 YOF with h/o Afib not on anticoagulation. Pharmacy consulted to start IV heparin. Initial heparin level is very high. RN reports it was drawn correctly. No bleeding noted.  ? ?Goal of Therapy:  ?Heparin level 0.3-0.7 units/ml ?Monitor platelets by anticoagulation protocol: Yes ?  ?Plan:  ?Hold heparin x 30 minutes ?Restart heparin gtt at 1000 units/hr ?Check an 8 hr heparin level ?Daily heparin level and CBC ? ?Salome Arnt, PharmD, BCPS ?Clinical Pharmacist ?Please see AMION for all pharmacy numbers ?11/04/2021 9:00 AM ? ? ? ? ?

## 2021-11-04 NOTE — ED Notes (Signed)
Patient had episode of small amount of emesis x1. Patient states she is still feeling nauseous. Admitting provider made aware of same, orders placed for nausea medication at this time.  ?

## 2021-11-05 DIAGNOSIS — I4891 Unspecified atrial fibrillation: Secondary | ICD-10-CM | POA: Diagnosis not present

## 2021-11-05 DIAGNOSIS — I10 Essential (primary) hypertension: Secondary | ICD-10-CM | POA: Diagnosis not present

## 2021-11-05 DIAGNOSIS — N179 Acute kidney failure, unspecified: Secondary | ICD-10-CM | POA: Diagnosis not present

## 2021-11-05 LAB — COMPREHENSIVE METABOLIC PANEL
ALT: 12 U/L (ref 0–44)
AST: 16 U/L (ref 15–41)
Albumin: 2.1 g/dL — ABNORMAL LOW (ref 3.5–5.0)
Alkaline Phosphatase: 55 U/L (ref 38–126)
Anion gap: 7 (ref 5–15)
BUN: 16 mg/dL (ref 8–23)
CO2: 22 mmol/L (ref 22–32)
Calcium: 7.7 mg/dL — ABNORMAL LOW (ref 8.9–10.3)
Chloride: 106 mmol/L (ref 98–111)
Creatinine, Ser: 1.36 mg/dL — ABNORMAL HIGH (ref 0.44–1.00)
GFR, Estimated: 40 mL/min — ABNORMAL LOW (ref >60)
Glucose, Bld: 137 mg/dL — ABNORMAL HIGH (ref 70–99)
Potassium: 3.3 mmol/L — ABNORMAL LOW (ref 3.5–5.1)
Sodium: 135 mmol/L (ref 135–145)
Total Bilirubin: 0.6 mg/dL (ref 0.3–1.2)
Total Protein: 4.5 g/dL — ABNORMAL LOW (ref 6.5–8.1)

## 2021-11-05 LAB — CBC
HCT: 34.4 % — ABNORMAL LOW (ref 36.0–46.0)
Hemoglobin: 11.8 g/dL — ABNORMAL LOW (ref 12.0–15.0)
MCH: 29.8 pg (ref 26.0–34.0)
MCHC: 34.3 g/dL (ref 30.0–36.0)
MCV: 86.9 fL (ref 80.0–100.0)
Platelets: 172 10*3/uL (ref 150–400)
RBC: 3.96 MIL/uL (ref 3.87–5.11)
RDW: 14.3 % (ref 11.5–15.5)
WBC: 7.9 10*3/uL (ref 4.0–10.5)
nRBC: 0 % (ref 0.0–0.2)

## 2021-11-05 LAB — GLUCOSE, CAPILLARY
Glucose-Capillary: 137 mg/dL — ABNORMAL HIGH (ref 70–99)
Glucose-Capillary: 133 mg/dL — ABNORMAL HIGH (ref 70–99)
Glucose-Capillary: 126 mg/dL — ABNORMAL HIGH (ref 70–99)
Glucose-Capillary: 139 mg/dL — ABNORMAL HIGH (ref 70–99)
Glucose-Capillary: 116 mg/dL — ABNORMAL HIGH (ref 70–99)
Glucose-Capillary: 121 mg/dL — ABNORMAL HIGH (ref 70–99)

## 2021-11-05 LAB — MAGNESIUM: Magnesium: 1.4 mg/dL — ABNORMAL LOW (ref 1.7–2.4)

## 2021-11-05 LAB — HEPARIN LEVEL (UNFRACTIONATED)
Heparin Unfractionated: 1.08 IU/mL — ABNORMAL HIGH (ref 0.30–0.70)
Heparin Unfractionated: 0.55 IU/mL (ref 0.30–0.70)

## 2021-11-05 MED ORDER — HEPARIN (PORCINE) 25000 UT/250ML-% IV SOLN
500.0000 [IU]/h | INTRAVENOUS | Status: DC
  Administered 2021-11-05: 500 [IU]/h via INTRAVENOUS

## 2021-11-05 MED ORDER — KCL IN DEXTROSE-NACL 40-5-0.45 MEQ/L-%-% IV SOLN
INTRAVENOUS | Status: AC
  Filled 2021-11-05: qty 1000

## 2021-11-05 MED ORDER — MAGNESIUM SULFATE 2 GM/50ML IV SOLN
2.0000 g | Freq: Once | INTRAVENOUS | Status: AC
  Administered 2021-11-05: 2 g via INTRAVENOUS
  Filled 2021-11-05: qty 50

## 2021-11-05 MED ORDER — POTASSIUM CHLORIDE 10 MEQ/100ML IV SOLN
10.0000 meq | INTRAVENOUS | Status: AC
  Administered 2021-11-05 (×2): 10 meq via INTRAVENOUS
  Filled 2021-11-05 (×2): qty 100

## 2021-11-05 NOTE — Plan of Care (Signed)
?  Problem: Nutrition: ?Goal: Adequate nutrition will be maintained ?Outcome: Not Progressing ?  ?Problem: Elimination: ?Goal: Will not experience complications related to urinary retention ?Outcome: Progressing ?  ?Problem: Safety: ?Goal: Ability to remain free from injury will improve ?Outcome: Progressing ?  ?Problem: Skin Integrity: ?Goal: Risk for impaired skin integrity will decrease ?Outcome: Progressing ?  ?

## 2021-11-05 NOTE — Progress Notes (Signed)
ANTICOAGULATION CONSULT NOTE ? ?Pharmacy Consult for heparin ?Indication: atrial fibrillation ? ?Allergies  ?Allergen Reactions  ? Lisinopril Cough  ? Biaxin [Clarithromycin] Rash  ? Penicillins Rash  ? ? ?Patient Measurements: ?Height: 5\' 5"  (165.1 cm) ?Weight: 74.8 kg (165 lb) (estimate per pt) ?IBW/kg (Calculated) : 57 ?Heparin Dosing Weight: 83 kg  ? ?Vital Signs: ?Temp: 97.8 ?F (36.6 ?C) (04/27 0329) ?Temp Source: Oral (04/27 0329) ?BP: 96/47 (04/27 0831) ?Pulse Rate: 78 (04/27 0831) ? ?Labs: ?Recent Labs  ?  11/03/21 ?1622 11/03/21 ?1714 11/03/21 ?2119 11/04/21 ?0603 11/04/21 ?11/06/21 11/04/21 ?1745 11/04/21 ?2143 11/05/21 ?11/07/21 11/05/21 ?0857  ?HGB 14.9  --  13.6 12.2  --   --   --   --  11.8*  ?HCT 44.8  --  40.0 37.4  --   --   --   --  34.4*  ?PLT 319  --   --  218  --   --   --   --  172  ?LABPROT 13.5  --   --   --   --   --   --   --   --   ?INR 1.0  --   --   --   --   --   --   --   --   ?HEPARINUNFRC  --   --   --   --    < > >1.10* >1.10*  --  1.08*  ?CREATININE 2.21*  --  1.70* 1.87*  --  1.52*  --  1.36*  --   ?TROPONINIHS 24* 25*  --  237*  --  174*  --   --   --   ? < > = values in this interval not displayed.  ? ? ? ?Estimated Creatinine Clearance: 35.1 mL/min (A) (by C-G formula based on SCr of 1.36 mg/dL (H)). ? ?Assessment: ?51 YOF with h/o Afib not on anticoagulation. Pharmacy consulted to start IV heparin.  ?-heparin level 1.08 after holding for 1 hr ? ?Goal of Therapy:  ?Heparin level 0.3-0.7 units/ml ?Monitor platelets by anticoagulation protocol: Yes ?  ?Plan:  ?Hold heparin x 1 hr ?Reduce to 500 units/hr on restart.  ?Check an 8 hr heparin level ?Daily heparin level and CBC ? ?62, PharmD ?Clinical Pharmacist ?**Pharmacist phone directory can now be found on [email or web address] ([email or web address]).  Listed under [facility]. ? ? ? ? ? ?

## 2021-11-05 NOTE — [place] FL2 (Addendum)
?[place] MEDICAID FL2 LEVEL OF CARE SCREENING TOOL  ?  ? ?IDENTIFICATION  ?Patient Name: ?Katherine Welch Birthdate: 05-29-1946 Sex: female Admission Date (Current Location): ?11/03/2021  ?[place] Number: ? [place] ?  Facility and Address:  ?[facility], [address] [place], [place] [address] ?     Provider Number: ?[id number]  ?Attending Physician Name and Address:  ?Tyrone Nine, MD ? Relative Name and Phone Number:  ?Lieutenant Diego [phone] ?   ?Current Level of Care: ?Hospital Recommended Level of Care: ?Assisted Living Facility ([facility]) Prior Approval Number: ?  ? ?Date Approved/Denied: ?  PASRR Number: ?  ? ?Discharge Plan: ?Other (Comment) ([facility]) ?  ? ?Current Diagnoses: ?Patient Active Problem List  ? Diagnosis Date Noted  ? Acute renal failure (ARF) (HCC) 11/03/2021  ? Atrial fibrillation with RVR (HCC) 11/03/2021  ? Essential hypertension 11/03/2021  ? ARF (acute renal failure) (HCC) 11/03/2021  ? Adjustment disorder with physical complaints   ? ? ?Orientation RESPIRATION BLADDER Height & Weight   ?  ?Self, Time, Situation, Place (WDL) ? Normal Incontinent, External catheter (External Urinary Catheter) Weight: 165 lb (74.8 kg) (estimate per pt) ?Height:  5\' 5"  (165.1 cm)  ?BEHAVIORAL SYMPTOMS/MOOD NEUROLOGICAL BOWEL NUTRITION STATUS  ?    Continent (WDL) Regular  ?AMBULATORY STATUS COMMUNICATION OF NEEDS Skin   ?Extensive Assist Verbally Other (Comment) (WDL) ?  ?  ?  ?    ?     ?     ? ? ?Personal Care Assistance Level of Assistance  ?Bathing, Feeding, Dressing Bathing Assistance: Maximum assistance ?Feeding assistance: Independent (able to feed self) ?Dressing Assistance: Maximum assistance ?   ? ?Functional Limitations Info  ?Sight, Hearing, Speech Sight Info:  (WDL) ?Hearing Info: Adequate ?Speech Info: Adequate  ? ? ?SPECIAL CARE FACTORS FREQUENCY  ?PT (By licensed PT), OT (By licensed OT)   ?  ?PT Frequency: 3x min weekly ?OT  Frequency: 3x min weekly ?  ?  ?  ?   ? ? ?Contractures Contractures Info: Not present  ? ? ?Additional Factors Info  ?Code Status, Allergies, Psychotropic Code Status Info: DNR ?Allergies Info: Lisinopril,Biaxin (clarithromycin),Penicillins ?Psychotropic Info: citalopram (CELEXA) tablet 20 mg every morning ?  ?  ?   ? ?TAKE these medications   ?  ?acetaminophen 500 MG tablet ?Commonly known as: TYLENOL ?Take 500 mg by mouth every 8 (eight) hours as needed (pain). ?   ?atorvastatin 10 MG tablet ?Commonly known as: LIPITOR ?Take 10 mg by mouth at bedtime. ?   ?calcitonin (salmon) 200 UNIT/ACT nasal spray ?Commonly known as: MIACALCIN/FORTICAL ?Place 1 spray into alternate nostrils daily. ?   ?celecoxib 200 MG capsule ?Commonly known as: CELEBREX ?Take 200 mg by mouth 2 (two) times daily. 8am, 5pm ?   ?citalopram 20 MG tablet ?Commonly known as: CELEXA ?Take 20 mg by mouth every morning. ?   ?DULoxetine 60 MG capsule ?Commonly known as: CYMBALTA ?Take 60 mg by mouth at bedtime. ?   ?ferrous sulfate 325 (65 FE) MG tablet ?Take 325 mg by mouth every evening. ?   ?fexofenadine 180 MG tablet ?Commonly known as: ALLEGRA ?Take 180 mg by mouth every morning. ?   ?fluticasone 50 MCG/ACT nasal spray ?Commonly known as: FLONASE ?Place 1 spray into both nostrils daily as needed (seasonal allergies). ?   ?HYDROcodone-acetaminophen 5-325 MG tablet ?Commonly known as: NORCO/VICODIN ?Take 1 tablet by mouth every 8 (eight) hours as needed. ?What changed:  ?  how much to take ?when to take this ?   ?lidocaine 4 % ?Place 1 patch onto the skin daily. ?   ?nystatin powder ?Commonly known as: MYCOSTATIN/NYSTOP ?Apply 1 application. topically 3 (three) times daily. ?   ?omeprazole 20 MG capsule ?Commonly known as: PRILOSEC ?Take 20 mg by mouth daily before breakfast. ?   ?ondansetron 4 MG tablet ?Commonly known as: ZOFRAN ?Take 4 mg by mouth every 8 (eight) hours as needed for nausea or vomiting. ?   ?risedronate 35 MG tablet ?Commonly known  as: ACTONEL ?Take 35 mg by mouth every 7 (seven) days. with water on empty stomach, nothing by mouth or lie down for next 30 minutes. ?   ?tiZANidine 4 MG tablet ?Commonly known as: ZANAFLEX ?Take 4 mg by mouth every 8 (eight) hours as needed for muscle spasms. ?   ?traZODone 100 MG tablet ?Commonly known as: DESYREL ?Take 100 mg by mouth at bedtime as needed for sleep. ?   ?Vitamin D3 50 MCG (2000 UT) capsule ?Take 2,000 Units by mouth every morning. ?   ?  ? ?Relevant Imaging Results: ? ?Relevant Lab Results: ? ? ?Additional Information ?SSN-[id number] ? ?Delilah Shan, LCSWA ? ? ? ? ?

## 2021-11-05 NOTE — Progress Notes (Signed)
ANTICOAGULATION CONSULT NOTE ? ?Pharmacy Consult for heparin ?Indication: atrial fibrillation ? ?Allergies  ?Allergen Reactions  ? Lisinopril Cough  ? Biaxin [Clarithromycin] Rash  ? Penicillins Rash  ? ? ?Patient Measurements: ?Height: 5\' 5"  (165.1 cm) ?Weight: 74.8 kg (165 lb) (estimate per pt) ?IBW/kg (Calculated) : 57 ?Heparin Dosing Weight: 83 kg  ? ?Vital Signs: ?Temp: 98.2 ?F (36.8 ?C) (04/27 2057) ?Temp Source: Oral (04/27 2057) ?BP: 104/72 (04/27 2057) ?Pulse Rate: 86 (04/27 2057) ? ?Labs: ?Recent Labs  ?  11/03/21 ?1622 11/03/21 ?1714 11/03/21 ?2119 11/04/21 ?0603 11/04/21 ?11/06/21 11/04/21 ?1745 11/04/21 ?2143 11/05/21 ?0326 11/05/21 ?0857 11/05/21 ?2158  ?HGB 14.9  --  13.6 12.2  --   --   --   --  11.8*  --   ?HCT 44.8  --  40.0 37.4  --   --   --   --  34.4*  --   ?PLT 319  --   --  218  --   --   --   --  172  --   ?LABPROT 13.5  --   --   --   --   --   --   --   --   --   ?INR 1.0  --   --   --   --   --   --   --   --   --   ?HEPARINUNFRC  --   --   --   --    < > >1.10* >1.10*  --  1.08* 0.55  ?CREATININE 2.21*  --  1.70* 1.87*  --  1.52*  --  1.36*  --   --   ?TROPONINIHS 24* 25*  --  237*  --  174*  --   --   --   --   ? < > = values in this interval not displayed.  ? ? ?Estimated Creatinine Clearance: 35.1 mL/min (A) (by C-G formula based on SCr of 1.36 mg/dL (H)). ? ?Assessment: ?70 YOF with h/o Afib not on anticoagulation. Pharmacy consulted to start IV heparin.  ? ?Heparin level therapeutic at current rate.  ? ?Goal of Therapy:  ?Heparin level 0.3-0.7 units/ml ?Monitor platelets by anticoagulation protocol: Yes ?  ?Plan:  ?Continue Heparin at  500 units/hr.  ?Daily heparin level and CBC ? ?62, PharmD, BCPS, BCCCP ?Clinical Pharmacist ?Please refer to [facility] for [facility] Pharmacy numbers ?11/05/2021, 11:07 PM ? ? ? ? ? ? ?

## 2021-11-05 NOTE — Progress Notes (Signed)
?Progress Note ? ?Patient: Katherine Welch [id number] DOB: Apr 27, 1946  ?DOA: 11/03/2021  DOS: 11/05/2021  ?  ?Brief hospital course: ?Shameia Burfield is a 76 y.o. female with a history of post-polio right foot drop, falls, HTN, HLD, depression, and recent pelvic fractures causing her to be increasingly bedbound the past few months. She presented to the ED 4/25 with nausea, poor oral intake, an episode of vomiting, and found to be hypotensive with AFib with RVR (new diagnosis). Lactic acid elevated to 4.5 and creatinine elevated from 0.9 to 2.2. BP responded to IV fluids and she's since converted to NSR. CT abdomen pelvis showed gallbladder distention with sludge but no inflammatory changes.  Ultrasound of the abdomen also showed gallbladder sludge but no interval changes.  Abdomen on exam appeared benign.  Patient admitted for acute renal failure with persistent nausea and vomiting. ? ?Assessment and Plan: ?Intractable nausea and vomiting: Unclear etiology. Acuity suggests gastroenteritis. Abd is benign, CT abd/pelvis without definite source.  ?- Clinically improving, tolerated clears. Advance diet today as tolerated, decrease IVF rate. No further work up currently planned. Continue antiemetic  ? ?Acute renal failure: Improved. Cr 2.2 on arrival >> 1.3 today, baseline presumed to be 0.9. Suspect may have element of ATN due to hypotension. No significant renal abnormality on CT. No casts mentioned on urine micro. Calcium oxalate noted without RBCs.  ?- Avoid nephrotoxins, continue IV fluids albeit at lower rate, monitor BMP in AM ? ?UTI:  ?- Continue ceftriaxone (tolerating this despite PCN allergy) pending culture (GNRs). Blood cultures NG2D ? ?Troponin elevation due to demand ischemia: No chest pain. Suspect this is due to the transient AFib with RVR which has resolved. Troponin peaked 237 > 174. Has been on heparin x48 hrs regardless for AFib stroke prevention. No WMA on echo.  ? ?Paroxysmal atrial  fibrillation with RVR: Converted back to NSR. TSH wnl at 1.690.  ?- Continue IV heparin for now. Near discharge, will discuss with patient and daughter going forward, though her risk for, and significant history of, falling would suggest risk of anticoagulation in this setting may be prohibitive long term.  ?- Supp K, Mg as below ?- Once BP stabilized, can restart home beta blocker, though HR has been down into 60's. ? ?HTN: BP soft. ?- Holding bisoprolol, HCTZ, losartan.  ? ?Hypokalemia, hypomagnesemia: Improved with aggressive supplementation. Will repeat supp today. ?- Continue supplementation and recheck this PM ? ?Lactic acidosis: Due to hypotension which is due to dehydration.  ?- Resolved with IVF. ? ?Frequent falls:  ?- PT/OT > will reach out to ALF to see if they can continue to manage her there. ? ?Pubic bone fractures: Subacute as well as vertebral compression fractures. ?- Pain control.  ? ?HLD:  ?- Continue statin ? ?GERD:  ?- Continue PPI ? ?Depression: Quiescent ?- Continue home meds ? ?Subjective: Tolerating liquids with nausea, frequent IV zofran. No emesis though. Amenable to advancing diet. Required total +2 assist for toileting.  ? ?Objective: ?Vitals:  ? 11/05/21 0040 11/05/21 0329 11/05/21 0348 11/05/21 0831  ?BP: (!) 111/58 (!) 95/44 94/64 (!) 96/47  ?Pulse: 79 82 81 78  ?Resp: 18 18 18 18   ?Temp: 97.7 ?F (36.5 ?C) 97.8 ?F (36.6 ?C)    ?TempSrc: Oral Oral    ?SpO2: 93% 100% 100% 100%  ?Weight:      ?Height:      ? ?Gen: 76 y.o. female in no distress ?Pulm: Nonlabored breathing room air. Clear. ?CV: Regular rate and  rhythm. No murmur, rub, or gallop. No JVD, no dependent edema. ?GI: Abdomen soft, non-tender, non-distended, with normoactive bowel sounds.  ?Ext: Warm, no deformities ?Skin: No rashes, lesions or ulcers on visualized skin. ?Neuro: Alert and appropriately conversant. No focal neurological deficits. ?Psych: Judgement and insight appear fair. Mood euthymic & affect congruent. Behavior  is appropriate.   ? ?Data Personally reviewed: ?CBC: ?Recent Labs  ?Lab 11/03/21 ?1622 11/03/21 ?2119 11/04/21 ?0603 11/05/21 ?[id number]  ?WBC 10.8*  --  11.0* 7.9  ?NEUTROABS 8.5*  --  8.5*  --   ?HGB 14.9 13.6 12.2 11.8*  ?HCT 44.8 40.0 37.4 34.4*  ?MCV 87.2  --  87.8 86.9  ?PLT 319  --  218 172  ? ?Basic Metabolic Panel: ?Recent Labs  ?Lab 11/03/21 ?1622 11/03/21 ?2119 11/04/21 ?0603 11/04/21 ?1745 11/05/21 ?0326  ?NA 138 138 140 138 135  ?K 2.5* 3.0* 2.7* 2.0* 3.3*  ?CL 95* 104 106 104 106  ?CO2 21*  --  21* 24 22  ?GLUCOSE 183* 100* 74 118* 137*  ?BUN 34* 29* 28* 21 16  ?CREATININE 2.21* 1.70* 1.87* 1.52* 1.36*  ?CALCIUM 9.3  --  7.9* 7.7* 7.7*  ?MG  --   --  0.8*  --  1.4*  ? ?GFR: ?Estimated Creatinine Clearance: 35.1 mL/min (A) (by C-G formula based on SCr of 1.36 mg/dL (H)). ?Liver Function Tests: ?Recent Labs  ?Lab 11/03/21 ?1622 11/04/21 ?0603 11/05/21 ?0326  ?AST 27 21 16   ?ALT 17 15 12   ?ALKPHOS 84 61 55  ?BILITOT 1.7* 1.7* 0.6  ?PROT 6.7 4.9* 4.5*  ?ALBUMIN 3.1* 2.3* 2.1*  ? ?Recent Labs  ?Lab 11/03/21 ?1622 11/04/21 ?0603  ?LIPASE 53* 41  ? ?No results for input(s): AMMONIA in the last 168 hours. ?Coagulation Profile: ?Recent Labs  ?Lab 11/03/21 ?1622  ?INR 1.0  ? ?Cardiac Enzymes: ?No results for input(s): CKTOTAL, CKMB, CKMBINDEX, TROPONINI in the last 168 hours. ?BNP (last 3 results) ?No results for input(s): PROBNP in the last 8760 hours. ?HbA1C: ?No results for input(s): HGBA1C in the last 72 hours. ?CBG: ?Recent Labs  ?Lab 11/04/21 ?2309 11/05/21 ?0227 11/05/21 ?[id number] 11/05/21 ?0740 11/05/21 ?1109  ?GLUCAP 137* 133* 126* 139* 116*  ? ?Lipid Profile: ?No results for input(s): CHOL, HDL, LDLCALC, TRIG, CHOLHDL, LDLDIRECT in the last 72 hours. ?Thyroid Function Tests: ?Recent Labs  ?  11/03/21 ?2334  ?TSH 1.690  ? ?Anemia Panel: ?No results for input(s): VITAMINB12, FOLATE, FERRITIN, TIBC, IRON, RETICCTPCT in the last 72 hours. ?Urine analysis: ?   ?Component Value Date/Time  ? COLORURINE AMBER (A)  11/03/2021 2207  ? APPEARANCEUR CLOUDY (A) 11/03/2021 2207  ? LABSPEC 1.012 11/03/2021 2207  ? PHURINE 7.0 11/03/2021 2207  ? [place] NEGATIVE 11/03/2021 2207  ? HGBUR LARGE (A) 11/03/2021 2207  ? [place] NEGATIVE 11/03/2021 2207  ? KETONESUR 5 (A) 11/03/2021 2207  ? PROTEINUR 30 (A) 11/03/2021 2207  ? NITRITE NEGATIVE 11/03/2021 2207  ? LEUKOCYTESUR MODERATE (A) 11/03/2021 2207  ? ?Recent Results (from the past 240 hour(s))  ?Resp Panel by RT-PCR (Flu A&B, Covid) Nasopharyngeal Swab     Status: None  ? Collection Time: 11/03/21  4:22 PM  ? Specimen: Nasopharyngeal Swab; Nasopharyngeal(NP) swabs in vial transport medium  ?Result Value Ref Range Status  ? SARS Coronavirus 2 by RT PCR NEGATIVE NEGATIVE Final  ?  Comment: (NOTE) ?SARS-CoV-2 target nucleic acids are NOT DETECTED. ? ?The SARS-CoV-2 RNA is generally detectable in upper respiratory ?specimens during the acute phase of infection.  The lowest ?concentration of SARS-CoV-2 viral copies this assay can detect is ?138 copies/mL. A negative result does not preclude SARS-Cov-2 ?infection and should not be used as the sole basis for treatment or ?other patient management decisions. A negative result may occur with  ?improper specimen collection/handling, submission of specimen other ?than nasopharyngeal swab, presence of viral mutation(s) within the ?areas targeted by this assay, and inadequate number of viral ?copies(<138 copies/mL). A negative result must be combined with ?clinical observations, patient history, and epidemiological ?information. The expected result is Negative. ? ?Fact Sheet for Patients:  ?EntrepreneurPulse.com.au ? ?Fact Sheet for Healthcare Providers:  ?IncredibleEmployment.be ? ?This test is no t yet approved or cleared by the [place] FDA and  ?has been authorized for detection and/or diagnosis of SARS-CoV-2 by ?FDA under an Emergency Use Authorization (EUA). This EUA will remain  ?in effect  (meaning this test can be used) for the duration of the ?COVID-19 declaration under Section 564(b)(1) of the Act, 21 ?U.S.C.section 360bbb-3(b)(1), unless the authorization is terminated  ?or revoked sooner.  ? ? ?  ? I

## 2021-11-05 NOTE — TOC Initial Note (Addendum)
Transition of Care (TOC) - Initial/Assessment Note  ? ? ?Patient Details  ?Name: Katherine Welch ?MRN: [id number] ?Date of Birth: 07-17-1945 ? ?Transition of Care (TOC) CM/SW Contact:    ?Milas Gain, LCSWA ?Phone Number: ?11/05/2021, 3:55 PM ? ?Clinical Narrative:               ? ?Update- CSW received callback from [facility] with [facility] who requested Progress notes,FL2, and PT/OT notes to review to determine if they will accept patient back. CSW faxed over requested clinicals for review.CSW will follow up with [place] in the morning. ? ?Update- CSW called Cherry at [facility] to see if they could accept patient tomorrow if medically ready for dc. CSW awaiting callback.  CSW heard back from patients daughter. Patients daughter plans on transporting patient back to ALF when patient is medically ready.  ? ?CSW met with patient at bedside. Patient reports she comes from [facility]. PT/OT recommended HH services at ALF. Patients plan is to return to ALF with [facility] orders. Patient reports she may need transport at dc to follow up with her daughter Katherine Welch on how she will transport back to ALF.CSW called [facility] and spoke with Lesslie who confirmed patient came from there. Cherry reports when patient is medically ready for dc to fax over DC summary, FL2, and [place] orders to fax # [phone] put Att to: [place]. Cherrys contact number is # [phone]. Patient gave CSW permission to call her daughter Katherine Welch to discuss dc plan. CSW called patients daughter Katherine Welch and LVM. CSW awaiting callback. CSW will continue to follow and assist with patients dc planning needs. ? ? ? ? ?Expected Discharge Plan: Assisted Living ?Barriers to Discharge: Continued Medical Work up ? ? ?Patient Goals and CMS Choice ?Patient states their goals for this hospitalization and ongoing recovery are:: to return back to ALF with [facility] services ?CMS Medicare.gov Compare Post Acute Care list provided to:: Patient ?Choice offered to / list  presented to : Patient ? ?Expected Discharge Plan and Services ?Expected Discharge Plan: Assisted Living ?In-house Referral: Clinical Social Work ?  ?  ?Living arrangements for the past 2 months: [place] (From [facility]) ?                ?  ?  ?  ?  ?  ?  ?  ?  ?  ?  ? ?Prior Living Arrangements/Services ?Living arrangements for the past 2 months: [place] (From [facility]) ?Lives with:: Facility Resident ?Patient language and need for interpreter reviewed:: Yes ?Do you feel safe going back to the place where you live?: Yes      ?Need for Family Participation in Patient Care: Yes (Comment) ?Care giver support system in place?: Yes (comment) ?  ?Criminal Activity/Legal Involvement Pertinent to Current Situation/Hospitalization: No - Comment as needed ? ?Activities of Daily Living ?  ?  ? ?Permission Sought/Granted ?Permission sought to share information with : Case Manager, Customer service manager, Family Supports ?Permission granted to share information with : Yes, Verbal Permission Granted ? Share Information with NAME: Tami ? Permission granted to share info w AGENCY: ALF ? Permission granted to share info w Relationship: daughter ? Permission granted to share info w Contact Information: Katherine Welch [phone] ? ?Emotional Assessment ?Appearance:: Appears stated age ?Attitude/Demeanor/Rapport: Gracious ?Affect (typically observed): Calm ?Orientation: : Oriented to Self, Oriented to Place, Oriented to  Time, Oriented to Situation ?Alcohol / Substance Use: Not Applicable ?Psych Involvement:  No (comment) ? ?Admission diagnosis:  Hypokalemia [E87.6] ?ARF (acute renal failure) ([place]) [N17.9] ?Acute renal failure (ARF) (HCC) [N17.9] ?AKI (acute kidney injury) ([place]) [N17.9] ?Atrial fibrillation, unspecified type ([place]) [I48.91] ?Patient Active Problem List  ? Diagnosis Date Noted  ? Acute renal failure (ARF) ([place]) 11/03/2021  ? Atrial fibrillation with RVR ([place])  11/03/2021  ? Essential hypertension 11/03/2021  ? ARF (acute renal failure) ([place]) 11/03/2021  ? Adjustment disorder with physical complaints   ? ?PCP:  Reymundo Poll, MD ?Pharmacy:  No Pharmacies Listed ? ? ? ?Social Determinants of Health (SDOH) Interventions ?  ? ?Readmission Risk Interventions ?   ? View : No data to display.  ?  ?  ?  ? ? ? ?

## 2021-11-06 ENCOUNTER — Other Ambulatory Visit (HOSPITAL_COMMUNITY): Payer: Self-pay

## 2021-11-06 LAB — MAGNESIUM: Magnesium: 1.6 mg/dL — ABNORMAL LOW (ref 1.7–2.4)

## 2021-11-06 LAB — URINE CULTURE: Culture: >=100000 — AB

## 2021-11-06 LAB — CBC
HCT: 30 % — ABNORMAL LOW (ref 36.0–46.0)
Hemoglobin: 10.2 g/dL — ABNORMAL LOW (ref 12.0–15.0)
MCH: 29.1 pg (ref 26.0–34.0)
MCHC: 34 g/dL (ref 30.0–36.0)
MCV: 85.5 fL (ref 80.0–100.0)
Platelets: 166 10*3/uL (ref 150–400)
RBC: 3.51 MIL/uL — ABNORMAL LOW (ref 3.87–5.11)
RDW: 14.3 % (ref 11.5–15.5)
WBC: 6.5 10*3/uL (ref 4.0–10.5)
nRBC: 0 % (ref 0.0–0.2)

## 2021-11-06 LAB — BASIC METABOLIC PANEL
Anion gap: 6 (ref 5–15)
BUN: 9 mg/dL (ref 8–23)
CO2: 21 mmol/L — ABNORMAL LOW (ref 22–32)
Calcium: 8.2 mg/dL — ABNORMAL LOW (ref 8.9–10.3)
Chloride: 108 mmol/L (ref 98–111)
Creatinine, Ser: 1.16 mg/dL — ABNORMAL HIGH (ref 0.44–1.00)
GFR, Estimated: 49 mL/min — ABNORMAL LOW (ref >60)
Glucose, Bld: 78 mg/dL (ref 70–99)
Potassium: 3.3 mmol/L — ABNORMAL LOW (ref 3.5–5.1)
Sodium: 135 mmol/L (ref 135–145)

## 2021-11-06 LAB — HEPARIN LEVEL (UNFRACTIONATED): Heparin Unfractionated: 0.47 IU/mL (ref 0.30–0.70)

## 2021-11-06 MED ORDER — POTASSIUM CHLORIDE CRYS ER 20 MEQ PO TBCR
40.0000 meq | EXTENDED_RELEASE_TABLET | Freq: Once | ORAL | Status: AC
Start: 2021-11-06 — End: 2021-11-06
  Administered 2021-11-06: 40 meq via ORAL
  Filled 2021-11-06: qty 2

## 2021-11-06 MED ORDER — HYDROCODONE-ACETAMINOPHEN 5-325 MG PO TABS: 1.0000 | ORAL_TABLET | Freq: Three times a day (TID) | ORAL | 0 refills | Status: AC | PRN

## 2021-11-06 MED ORDER — MAGNESIUM SULFATE 2 GM/50ML IV SOLN
2.0000 g | Freq: Once | INTRAVENOUS | Status: AC
  Administered 2021-11-06: 2 g via INTRAVENOUS
  Filled 2021-11-06: qty 50

## 2021-11-06 MED ORDER — ONDANSETRON 4 MG PO TBDP
4.0000 mg | ORAL_TABLET | Freq: Three times a day (TID) | ORAL | Status: DC | PRN
  Filled 2021-11-06: qty 1

## 2021-11-06 NOTE — Plan of Care (Signed)
Pt refusing to eat only wants cola.  ? ?Problem: Nutrition: ?Goal: Adequate nutrition will be maintained ?Outcome: Not Progressing ?  ?Problem: Safety: ?Goal: Ability to remain free from injury will improve ?Outcome: Progressing ?  ?Problem: Skin Integrity: ?Goal: Risk for impaired skin integrity will decrease ?Outcome: Progressing ?  ?Problem: Nutrition ?Goal: Patient maintains adequate hydration ?Outcome: Not Progressing ?  ?

## 2021-11-06 NOTE — Care Management Important Message (Signed)
Important Message ? ?Patient Details  ?Name: Katherine Welch ?MRN: [id number] ?Date of Birth: [birth year] ? ? ?Medicare Important Message Given:  Yes ? ? ? ? ?Renie Ora ?11/06/2021, 7:53 AM ?

## 2021-11-06 NOTE — TOC Transition Note (Addendum)
Transition of Care (TOC) - CM/SW Discharge Note ? ? ?Patient Details  ?Name: Katherine Welch ?MRN: [id number] ?Date of Birth: 04-03-1946 ? ?Transition of Care (TOC) CM/SW Contact:  ?Delilah Shan, LCSWA ?Phone Number: ?11/06/2021, 11:31 AM ? ? ?Clinical Narrative:    ? ?Cherry with [facility] confirmed they an accept patient back today for dc. CSW informed MD. Valentino Saxon with ALF reports she will have [facility] follow up with family when patient gets there regarding palliative services following patient at ALF. CSW informed patients daughter Lieutenant Diego. Tami thanked CSW. ? ? ?Patient will DC to: [facility]  ? ?Anticipated DC date: 11/06/2021 ? ?Family notified: Tami  ? ?Transport by: Sharin Mons ? ?? ? ?Per MD patient ready for DC to [facility] with HH orders . RN, patient, patient's family, and facility notified of DC. Discharge Summary, FL2 , and HH orders sent to facility. RN given number for report tele#[phone]. DC packet on chart. DNR signed by MD attached to patientd DC packet.Ambulance transport requested for patient. ? ?CSW signing off.  ?Final next level of care: Assisted Living ([facility]) ?Barriers to Discharge: No Barriers Identified ? ? ?Patient Goals and CMS Choice ?Patient states their goals for this hospitalization and ongoing recovery are:: to return to ALF with [facility] orders ?CMS Medicare.gov Compare Post Acute Care list provided to:: Patient ?Choice offered to / list presented to : Patient ? ?Discharge Placement ?  ?           ?Patient chooses bed at: [facility] (ALF) ?Patient to be transferred to facility by: [facility] ?Name of family member notified: Tami ?Patient and family notified of of transfer: 11/06/21 ? ?Discharge Plan and Services ?In-house Referral: Clinical Social Work ?  ?           ?  ?  ?  ?  ?  ?  ?  ?  ?  ?  ? ?Social Determinants of Health (SDOH) Interventions ?  ? ? ?Readmission Risk Interventions ?   ? View : No data to display.  ?  ?  ?  ? ? ? ? ? ?

## 2021-11-06 NOTE — Progress Notes (Signed)
OT Cancellation Note ? ?Patient Details ?Name: Katherine Welch ?MRN: [id number] ?DOB: 11-27-1945 ? ? ?Cancelled Treatment:    Reason Eval/Treat Not Completed: Other (comment)- pt declined, reports "I'm not doing therapy this early, I usually sleep in until 10".  Educated on benefits of OT, goals; pt continues to decline. Will follow and see as able. ? ?Barry Brunner, OT ?Acute Rehabilitation Services ?Pager [phone] ?Office [phone] ? ? ?Chancy Milroy ?11/06/2021, 9:08 AM ?

## 2021-11-06 NOTE — TOC Benefit Eligibility Note (Signed)
Patient Advocate Encounter ? ?Insurance verification completed.   ? ?The patient is currently admitted and upon discharge could be taking Eliquis 5 mg. ? ?The current 30 day co-pay is, $43.50.  ? ?The patient is currently admitted and upon discharge could be taking Xarelto 20 mg. ? ?The current 30 day co-pay is, $43.50.  ? ?The patient is insured through [employer] Part D  ? ? ? ?Lyndel Safe, CPhT ?Pharmacy Patient Advocate Specialist ?[facility] Patient Advocate Team ?Direct Number: [phone]  Fax: [phone] ? ? ? ? ? ?  ?

## 2021-11-06 NOTE — Progress Notes (Signed)
ANTICOAGULATION CONSULT NOTE ? ?Pharmacy Consult for heparin ?Indication: atrial fibrillation ? ?Allergies  ?Allergen Reactions  ? Lisinopril Cough  ? Biaxin [Clarithromycin] Rash  ? Penicillins Rash  ? ? ?Patient Measurements: ?Height: 5\' 5"  (165.1 cm) ?Weight: 74.8 kg (165 lb) (estimate per pt) ?IBW/kg (Calculated) : 57 ?Heparin Dosing Weight: 83 kg  ? ?Vital Signs: ?Temp: 97.7 ?F (36.5 ?C) (04/28 0500) ?Temp Source: Oral (04/28 0500) ?BP: 115/49 (04/28 0500) ?Pulse Rate: 85 (04/28 0500) ? ?Labs: ?Recent Labs  ?  11/03/21 ?1622 11/03/21 ?1714 11/03/21 ?2119 11/04/21 ?0603 11/04/21 ?[id number] 11/04/21 ?1745 11/04/21 ?2143 11/05/21 ?[id number] 11/05/21 ?[id number] 11/05/21 ?2158 11/06/21 ?0413  ?HGB 14.9  --    < > 12.2  --   --   --   --  11.8*  --  10.2*  ?HCT 44.8  --    < > 37.4  --   --   --   --  34.4*  --  30.0*  ?PLT 319  --   --  218  --   --   --   --  172  --  166  ?LABPROT 13.5  --   --   --   --   --   --   --   --   --   --   ?INR 1.0  --   --   --   --   --   --   --   --   --   --   ?HEPARINUNFRC  --   --   --   --    < > >1.10*   < >  --  1.08* 0.55 0.47  ?CREATININE 2.21*  --    < > 1.87*  --  1.52*  --  1.36*  --   --  1.16*  ?TROPONINIHS 24* 25*  --  237*  --  174*  --   --   --   --   --   ? < > = values in this interval not displayed.  ? ? ? ?Estimated Creatinine Clearance: 41.1 mL/min (A) (by C-G formula based on SCr of 1.16 mg/dL (H)). ? ?Assessment: ?51 YOF with h/o Afib not on anticoagulation. Pharmacy consulted to dose IV heparin.  ? ?-Heparin level therapeutic at current rate. Hg= 10.2 ?-per notes need to weight benefit/risk of oral anticoagulation due to history of falls ? ?Goal of Therapy:  ?Heparin level 0.3-0.7 units/ml ?Monitor platelets by anticoagulation protocol: Yes ?  ?Plan:  ?Continue Heparin at  500 units/hr.  ?Daily heparin level and CBC ?Will follow anticoagulation plans ? ?Hildred Laser, PharmD ?Clinical Pharmacist ?**Pharmacist phone directory can now be found on [email or web address] ([email or web address]).   Listed under [facility]. ? ? ? ? ? ? ?

## 2021-11-06 NOTE — Discharge Summary (Signed)
?Physician Discharge Summary ?  ?Patient: Katherine Welch MRN: [id number] DOB: [birth year]  ?Admit date:     11/03/2021  ?Discharge date: 11/06/21  ?Discharge Physician: Patrecia Pour  ? ?PCP: Reymundo Poll, MD  ? ?Recommendations at discharge:  ?Follow up with [facility] for palliative care services.  ?Recheck BMP, magnesium, and CBC in the next week. ?Continue fall precautions. Note fall risk is felt to be prohibitive for anticoagulation for stroke prevention with brief atrial fibrillation.  ?Consider cardiology evaluation. Pt had new onset atrial fibrillation at admission which spontaneously converted, and has remained converted, to NSR without intervention. Antihypertensive medications are held at discharge due to soft BP. ? ?Discharge Diagnoses: ?Principal Problem: ?  Acute renal failure (ARF) ([place]) ?Active Problems: ?  Atrial fibrillation with RVR ([place]) ?  Essential hypertension ?  ARF (acute renal failure) ([place]) ? ?Hospital Course: ?Katherine Welch is a 76 y.o. female with a history of post-polio right foot drop, falls, HTN, HLD, depression, and recent pelvic fractures causing her to be increasingly bedbound the past few months. She presented to the ED 4/25 with nausea, poor oral intake, an episode of vomiting, and found to be hypotensive with AFib with RVR (new diagnosis). Lactic acid elevated to 4.5 and creatinine elevated from 0.9 to 2.2. BP responded to IV fluids and she's since converted to NSR. CT abdomen pelvis showed gallbladder distention with sludge but no inflammatory changes.  Ultrasound of the abdomen also showed gallbladder sludge but no interval changes.  Abdomen on exam appeared benign.  Patient admitted for acute renal failure with persistent nausea and vomiting. ? ?With IV fluids and electrolyte repletion, renal function has returned near baseline and with antiemetics, the patient has been able to tolerate per oral nutrition/fluids. She continues to have very poor appetite and  is considering a change in the goals of her care. Palliative care as well as home health PT and OT are recommended at discharge. DNR confirmed.  ? ?Assessment and Plan: ?Intractable nausea and vomiting: Unclear etiology. Acuity suggests gastroenteritis. Abd is benign, CT abd/pelvis without definite source. UTI is being treated but doubt that is causative. Adrenal insufficiency is considered unlikely without typical electrolyte disturbances, unremarkable adrenal appearance on CT, or other clinical features. TSH is wnl.  ?- Clinically improving, has tolerated regular diet, though appetite remains very poor. I expressed my worry that this could represent failure to thrive and that she could become dehydrated with very similar presentation causing return to ED if she doesn't push herself to take adequate fluids/nutrition. We will continue zofran prn. She does not currently require IV fluids or IV medications, so will be discharged. Also discussed this with her daughter. We will refer to community palliative care for follow up and assistance in navigating the possible transition more toward comfort measures.   ?  ?Acute renal failure: Improved. Cr 2.2 on arrival >> 1.1 today, baseline presumed to be 0.9. No significant renal abnormality on CT. No casts mentioned on urine micro. Calcium oxalate noted without RBCs.  ?- Avoid nephrotoxins. ?- Recheck BMP in AM ? ?UTI: No significant urinary complaints. Has received 3 days of ceftriaxone, to which Klebsiella growing in culture is sensitive. WBC is normal, pt afebrile without suprapubic tenderness. Will consider abx course completed. Blood cultures NG3D.   ?  ?Troponin elevation due to demand ischemia: No chest pain. Suspect this is due to the transient AFib with RVR which has resolved. Troponin peaked 237 > 174. Has been on heparin x48  hrs regardless for AFib stroke prevention. No WMA on echo.  ?- Consider cardiology evaluation as an outpatient. ?- We're having to hold  bisoprolol due to low-normal BP.  ?  ?Paroxysmal atrial fibrillation with RVR: Converted back to NSR. TSH wnl at 1.690.  ?- I've discussed the risk of stroke based on her CHA2DS2-VASc score of 4 (driven by age and sex, also HTN which is not a current issue) which is ~4%. In comparison to her much greater likelihood of recurrent fall with uncontrolled bleeding/ICH, we have decided to not continue anticoagulation at discharge.  ?  ?HTN: BP soft. ?- Holding bisoprolol, HCTZ, losartan.  ?  ?Hypokalemia, hypomagnesemia: Improved with aggressive supplementation. Pt having difficulty swallowing potassium supplementation.  ?- We will not continue this at discharge but BMP will need to be monitored. Encouraged increased po intake inclusive of fruits.  ?  ?Lactic acidosis: Due to hypotension which is due to dehydration.  ?- Resolved ?  ?Frequent falls:  ?- PT/OT to continue at ALF. ?  ?Pubic bone fractures: Subacute as well as vertebral compression fractures. ?- Pain control continued. ?  ?HLD:  ?- Continue statin ?  ?GERD:  ?- Continue PPI ?  ?Depression: Quiescent. She has had general decline since husband died. Her situation/increasing dependence and pain due to falls have led her to wonder if she might just want to stay in a wheelchair and read books for the rest of her life. She has no SI at all and has broad, appropriate affect. Do not believe MDD is, in itself, impacting care or primary contribution to failure to thrive as discussed above. ?- Continue home meds ?- [facility] will follow up with the patient at ALF.  ? ?Consultants: None ?Procedures performed: None  ?Disposition: Assisted living ?Diet recommendation: Unrestricted. ?DISCHARGE MEDICATION: ?Allergies as of 11/06/2021   ? ?   Reactions  ? Lisinopril Cough  ? Biaxin [clarithromycin] Rash  ? Penicillins Rash  ? ?  ? ?  ?Medication List  ?  ? ?STOP taking these medications   ? ?bisoprolol-hydrochlorothiazide 5-6.25 MG tablet ?Commonly known as: ZIAC ?   ?losartan 50 MG tablet ?Commonly known as: COZAAR ?  ? ?  ? ?TAKE these medications   ? ?acetaminophen 500 MG tablet ?Commonly known as: TYLENOL ?Take 500 mg by mouth every 8 (eight) hours as needed (pain). ?  ?atorvastatin 10 MG tablet ?Commonly known as: LIPITOR ?Take 10 mg by mouth at bedtime. ?  ?calcitonin (salmon) 200 UNIT/ACT nasal spray ?Commonly known as: MIACALCIN/FORTICAL ?Place 1 spray into alternate nostrils daily. ?  ?celecoxib 200 MG capsule ?Commonly known as: CELEBREX ?Take 200 mg by mouth 2 (two) times daily. 8am, 5pm ?  ?citalopram 20 MG tablet ?Commonly known as: CELEXA ?Take 20 mg by mouth every morning. ?  ?DULoxetine 60 MG capsule ?Commonly known as: CYMBALTA ?Take 60 mg by mouth at bedtime. ?  ?ferrous sulfate 325 (65 FE) MG tablet ?Take 325 mg by mouth every evening. ?  ?fexofenadine 180 MG tablet ?Commonly known as: ALLEGRA ?Take 180 mg by mouth every morning. ?  ?fluticasone 50 MCG/ACT nasal spray ?Commonly known as: FLONASE ?Place 1 spray into both nostrils daily as needed (seasonal allergies). ?  ?HYDROcodone-acetaminophen 5-325 MG tablet ?Commonly known as: NORCO/VICODIN ?Take 1 tablet by mouth every 8 (eight) hours as needed. ?What changed:  ?how much to take ?when to take this ?  ?lidocaine 4 % ?Place 1 patch onto the skin daily. ?  ?nystatin powder ?Commonly known as:  MYCOSTATIN/NYSTOP ?Apply 1 application. topically 3 (three) times daily. ?  ?omeprazole 20 MG capsule ?Commonly known as: PRILOSEC ?Take 20 mg by mouth daily before breakfast. ?  ?ondansetron 4 MG tablet ?Commonly known as: ZOFRAN ?Take 4 mg by mouth every 8 (eight) hours as needed for nausea or vomiting. ?  ?risedronate 35 MG tablet ?Commonly known as: ACTONEL ?Take 35 mg by mouth every 7 (seven) days. with water on empty stomach, nothing by mouth or lie down for next 30 minutes. ?  ?tiZANidine 4 MG tablet ?Commonly known as: ZANAFLEX ?Take 4 mg by mouth every 8 (eight) hours as needed for muscle spasms. ?  ?traZODone  100 MG tablet ?Commonly known as: DESYREL ?Take 100 mg by mouth at bedtime as needed for sleep. ?  ?Vitamin D3 50 MCG (2000 UT) capsule ?Take 2,000 Units by mouth every morning. ?  ? ?  ? ? Follow-up Info

## 2021-11-08 LAB — CULTURE, BLOOD (ROUTINE X 2)
Culture: NO GROWTH
Culture: NO GROWTH
Special Requests: ADEQUATE

## 2021-11-13 ENCOUNTER — Non-Acute Institutional Stay: Payer: Medicare Other | Admitting: Hospice

## 2021-11-13 DIAGNOSIS — R531 Weakness: Secondary | ICD-10-CM

## 2021-11-13 DIAGNOSIS — F339 Major depressive disorder, recurrent, unspecified: Secondary | ICD-10-CM

## 2021-11-13 DIAGNOSIS — R112 Nausea with vomiting, unspecified: Secondary | ICD-10-CM

## 2021-11-13 DIAGNOSIS — Z515 Encounter for palliative care: Secondary | ICD-10-CM

## 2021-11-13 DIAGNOSIS — I10 Essential (primary) hypertension: Secondary | ICD-10-CM

## 2021-11-13 NOTE — Progress Notes (Addendum)
? ? ?Manufacturing engineer ?Community Palliative Care Consult Note ?Telephone: [phone]  ?Fax: [phone] ? ?PATIENT NAME: Katherine Welch ?Katherine Welch ?[address] ?Katherine Welch [address] ?[phone] (home)  ?DOB: 1946-05-10 ?MRN: [id number] ? ?PRIMARY CARE PROVIDER:    ?Reymundo Poll, MD,  ?[address] ?[place] [address] ?[phone] ? ?REFERRING PROVIDER:   ?Reymundo Poll, MD ?[address] [place]. ?[address] ?[place],   [address] ?[phone] ? ?RESPONSIBLE PARTY:   Tami ?Contact Information   ? ? Name Relation Home Work Mobile  ? Draves,Tami Daughter   [phone]  ? Pietra, Zuluaga Daughter   [phone]  ? ?  ? ?I met face to face with patient at facility. Visit to build trust and highlight Palliative Medicine as specialized medical care for people living with serious illness, aimed at facilitating better quality of life through symptoms relief, assisting with advance care planning and complex medical decision making.  Patient endorsed palliative service.  NP called Tami and left her a voicemail with callback number. ? ?ASSESSMENT AND / RECOMMENDATIONS:  ? ?Advance Care Planning: Our advance care planning conversation included a discussion about:    ?The value and importance of advance care planning  ?Difference between Hospice and Palliative care ?Exploration of goals of care in the event of a sudden injury or illness  ?Identification and preparation of a healthcare agent  ?Review and updating or creation of an  advance directive document . ?Decision not to resuscitate or to de-escalate disease focused treatments due to poor prognosis. ? ?CODE STATUS: Patient affirmed she is a DNR ? ?Goals of Care: Goals include to maximize quality of life and symptom management ? ?I spent 16 minutes providing this initial consultation. More than 50% of the time in this consultation was spent on counseling patient and coordinating  communication. ?-------------------------------------------------------------------------------------------------------------------------------------- ? ?Symptom Management/Plan: ?Nausea/vomiting: Improved since treated during hospitalization 11/03/21 - 11/06/21. Continue Zofran as ordered. CT abd/pelvis unremarkable.  ?HTN: Managed with Losartan, Bisoprolol ?Depression:: Managed with Duloxetine, Citalopram. Patient reports she enjoys [facility]. Encourage reading and participation in facility activities.  ?Weakness: acute on chronic following recent hospitalization. Continue PT/OT.  ?Gait disturbance/frequent fall: Continue PT/OT for strengthening and gait training.  Routine CBC CMP ? ?Follow up: Palliative care will continue to follow for complex medical decision making, advance care planning, and clarification of goals. Return 6 weeks or prn. Encouraged to call provider sooner with any concerns.  ? ?Family /Caregiver/Community Supports: Patient in assisted living for ongoing care ? ? ?HOSPICE ELIGIBILITY/DIAGNOSIS: TBD ? ?Chief Complaint: Initial Palliative care visit ? ?HISTORY OF PRESENT ILLNESS:  Katherine Welch is a 76 y.o. year old female  with multiple morbidities requiring close monitoring and with high risk of complications and  mortality: Hypertension, depression, nausea/vomiting, recent sacral bone fracture.  Patient reports no vomiting today.  Nursing with no concerns. History obtained from review of EMR, discussion with primary team, caregiver, family and/or [name].  ?Review and summarization of Epic records shows history from other than patient. Rest of 10 point ROS asked and negative. Independent interpretation of tests and reviewed as needed, available labs, patient records, imaging, studies and related documents from the EMR. ? ? ?Physical Exam: ?Constitutional: NAD ?General: Well groomed, cooperative ?EYES: anicteric sclera, lids intact, no discharge  ?ENMT: Moist mucous  membrane ?CV: S1 S2, RRR, no LE edema ?Pulmonary: LCTA, no increased work of breathing, no cough, ?Abdomen: active BS + 4 quadrants, soft and non tender ?GU: no suprapubic tenderness ?  MSK: weakness, limited ROM ?Skin: warm and dry, no rashes or wounds on visible skin ?Neuro:  weakness, otherwise non focal ?Psych: non-anxious affect ?Hem/lymph/immuno: no widespread bruising ? ? ?PAST MEDICAL HISTORY:  ?Active Ambulatory Problems  ?  Diagnosis Date Noted  ? Adjustment disorder with physical complaints   ? Acute renal failure (ARF) ([place]) 11/03/2021  ? Atrial fibrillation with RVR ([place]) 11/03/2021  ? Essential hypertension 11/03/2021  ? ARF (acute renal failure) ([place]) 11/03/2021  ? ?Resolved Ambulatory Problems  ?  Diagnosis Date Noted  ? No Resolved Ambulatory Problems  ? ?Past Medical History:  ?Diagnosis Date  ? Chronic hip pain   ? GERD (gastroesophageal reflux disease)   ? Hypertension   ? Major depressive disorder   ? Polio   ? Vitamin D deficiency   ? ? ?SOCIAL HX:  ?Social History  ? ?Tobacco Use  ? Smoking status: Never  ?  Passive exposure: Never  ? Smokeless tobacco: Never  ?Substance Use Topics  ? Alcohol use: Not Currently  ? ?  ?FAMILY HX: No family history on file.   ? ?ALLERGIES:  ?Allergies  ?Allergen Reactions  ? Lisinopril Cough  ? Biaxin [Clarithromycin] Rash  ? Penicillins Rash  ?   ? ?PERTINENT MEDICATIONS:  ?Outpatient Encounter Medications as of 11/13/2021  ?Medication Sig  ? acetaminophen (TYLENOL) 500 MG tablet Take 500 mg by mouth every 8 (eight) hours as needed (pain).  ? atorvastatin (LIPITOR) 10 MG tablet Take 10 mg by mouth at bedtime.  ? calcitonin, salmon, (MIACALCIN/FORTICAL) 200 UNIT/ACT nasal spray Place 1 spray into alternate nostrils daily.  ? celecoxib (CELEBREX) 200 MG capsule Take 200 mg by mouth 2 (two) times daily. 8am, 5pm  ? Cholecalciferol (VITAMIN D3) 50 MCG (2000 UT) capsule Take 2,000 Units by mouth every morning.  ? citalopram (CELEXA) 20 MG tablet Take 20 mg by mouth  every morning.  ? DULoxetine (CYMBALTA) 60 MG capsule Take 60 mg by mouth at bedtime.  ? ferrous sulfate 325 (65 FE) MG tablet Take 325 mg by mouth every evening.  ? fexofenadine (ALLEGRA) 180 MG tablet Take 180 mg by mouth every morning.  ? fluticasone (FLONASE) 50 MCG/ACT nasal spray Place 1 spray into both nostrils daily as needed (seasonal allergies).  ? HYDROcodone-acetaminophen (NORCO/VICODIN) 5-325 MG tablet Take 1 tablet by mouth every 8 (eight) hours as needed.  ? lidocaine 4 % Place 1 patch onto the skin daily.  ? nystatin (MYCOSTATIN/NYSTOP) powder Apply 1 application. topically 3 (three) times daily.  ? omeprazole (PRILOSEC) 20 MG capsule Take 20 mg by mouth daily before breakfast.  ? ondansetron (ZOFRAN) 4 MG tablet Take 4 mg by mouth every 8 (eight) hours as needed for nausea or vomiting.  ? risedronate (ACTONEL) 35 MG tablet Take 35 mg by mouth every 7 (seven) days. with water on empty stomach, nothing by mouth or lie down for next 30 minutes.  ? tiZANidine (ZANAFLEX) 4 MG tablet Take 4 mg by mouth every 8 (eight) hours as needed for muscle spasms.  ? traZODone (DESYREL) 100 MG tablet Take 100 mg by mouth at bedtime as needed for sleep.  ? ?No facility-administered encounter medications on file as of 11/13/2021.  ? ? ? ?Thank you for the opportunity to participate in the care of Ms. Kitts.  The palliative care team will continue to follow. Please call our office at [phone] if we can be of additional assistance.  ? ?Note: Portions of this note were generated with Dragon dictation  software. Dictation errors may occur despite best attempts at proofreading. ? ?Teodoro Spray, NP  ? ?  ?

## 2021-11-19 ENCOUNTER — Telehealth: Payer: Self-pay | Admitting: Hospice

## 2021-11-19 DIAGNOSIS — Z515 Encounter for palliative care: Secondary | ICD-10-CM

## 2021-11-19 NOTE — Telephone Encounter (Signed)
Patient's daughter Vy called requesting update on NP's last visit with patient.  She reported her Sister Tammy received NP's voicemail after the visit but could not come call back yet.  NP provided update on patient's status/ongoing decline in functional status.  She requests if NP could be present in the family meeting with patient to discuss more on advance care planning and goals of care.  NP assured her of her readiness for the meeting when they decide on a date.  Tierany would call NP and inform of date. ?

## 2021-11-27 ENCOUNTER — Non-Acute Institutional Stay: Payer: Medicare Other | Admitting: Hospice

## 2021-11-27 DIAGNOSIS — F339 Major depressive disorder, recurrent, unspecified: Secondary | ICD-10-CM

## 2021-11-27 DIAGNOSIS — R531 Weakness: Secondary | ICD-10-CM

## 2021-11-27 DIAGNOSIS — Z515 Encounter for palliative care: Secondary | ICD-10-CM

## 2021-11-27 DIAGNOSIS — R112 Nausea with vomiting, unspecified: Secondary | ICD-10-CM

## 2021-11-27 DIAGNOSIS — R413 Other amnesia: Secondary | ICD-10-CM

## 2021-11-27 NOTE — Progress Notes (Signed)
[facility] Consult Note Telephone: [phone]  Fax: [phone]  PATIENT NAME: Katherine Welch [address] [place] [address] [phone] (home) [phone] (work) DOB: 1946-02-21 MRN: [id number]  PRIMARY CARE PROVIDER:    Reymundo Poll, MD,  [place]. [address] [place] [address] [phone]  REFERRING PROVIDER:   Reymundo Poll, MD [place]. [place],  [place] [address] [phone]  RESPONSIBLE PARTY:   Tami Contact Information     Name Relation Home Work Mobile   Draves,Tami Daughter   [phone]   Marcey, Persad Daughter   [phone]      I met face to face with patient and her daughter Sondra Barges at facility. Kaneshia joined the meeting via speaker phone. Visit is to facilitate advance care planning discussion with patient and family. Visit is to build trust and highlight Palliative Medicine as specialized medical care for people living with serious illness, aimed at facilitating better quality of life through symptoms relief, assisting with advance care planning and complex medical decision making.   ASSESSMENT AND / RECOMMENDATIONS:   Advance Care Planning: Our advance care planning conversation included a discussion about:    The value and importance of advance care planning  Difference between Hospice and Palliative care Exploration of goals of care in the event of a sudden injury or illness  Identification and preparation of a healthcare agent  Review and updating or creation of an  advance directive document .   CODE STATUS: Patient affirmed she is a DNR  Goals of Care: Goals include to maximize quality of life and symptom management. Discussion on goals of care clarification using MOST form.  Today, selections include comfort measures, antibiotics if indicated, IV fluids for a defined trial, no feeding tube.  MOST form signed by NP and HCPOA.  Signed document given to nursing  for patient's chart; same document uploaded to epic. Patient desires hospice care when she qualifies for it.  Patient/family questions were answered to their satisfaction.. NP called facility NP Alver Fisher and updated on visit/goals of care and recommendation  Symptom Management/Plan: Nausea/vomiting: Managed with  Zofran CT abd/pelvis last month unremarkable. Ensure patient having regular bowel movements.  Recommendation: MiraLAX 17 g in 4 to 6 ounces of fluid daily as needed for constipation. HTN: Managed with Losartan, Bisoprolol Depression: Continue duloxetine, Citalopram. Patient enjoys reading.  Encourage reading and participation in facility activities. Order for Mental health eval and treat. Memory loss: Ongoing.  Encourage reminiscence.  Use cues as needed.  Weakness: Continue PT/OT for strengthening, bed mobility. Tami reports patient sometimes refuses. Education provided on benefits and need to participate. Follow up: Palliative care will continue to follow for complex medical decision making, advance care planning, and clarification of goals. Return 6 weeks or prn. Encouraged to call provider sooner with any concerns.   Family /Caregiver/Community Supports: Patient in assisted living for ongoing care.  Patient's daughters Dionne Milo and Sondra Barges are involved in patient's care.  Strong family support system identified.   HOSPICE ELIGIBILITY/DIAGNOSIS: TBD  Chief Complaint: follow up visit  HISTORY OF PRESENT ILLNESS:  Katherine Welch is a 76 y.o. year old female  with multiple morbidities requiring close monitoring and with high risk of complications and  mortality: Hypertension, depression, nausea/vomiting, recent sacral bone fracture, post polio right foot drop.,  GERD, weakness.  Patient continues in overall decline in functional status.  Patient/family elects comfort measures and are open to hospice service in the  future. History obtained from review of EMR, discussion with primary team,  caregiver, family and/or [name].  Review and summarization of Epic records shows history from other than patient. Rest of 10 point ROS asked and negative.  I reviewed as needed, available labs, patient records, imaging, studies and related documents from the EMR.   Physical Exam: Height/weight: 5 feet 6 inches/155 Ib down from 165 Ib last month and 210 Ibs 2 years ago.  Constitutional: NAD General: Well groomed, cooperative EYES: anicteric sclera, lids intact, no discharge  ENMT: Moist mucous membrane CV: S1 S2, RRR, no LE edema Pulmonary: LCTA, no increased work of breathing, no cough, Abdomen: active BS + 4 quadrants, soft and non tender GU: no suprapubic tenderness MSK: weakness, limited ROM Skin: warm and dry, no rashes or wounds on visible skin Neuro:  weakness, otherwise non focal, memory loss Psych: non-anxious affect Hem/lymph/immuno: no widespread bruising   PAST MEDICAL HISTORY:  Active Ambulatory Problems    Diagnosis Date Noted   Adjustment disorder with physical complaints    Acute renal failure (ARF) ([facility]) 11/03/2021   Atrial fibrillation with RVR ([facility]) 11/03/2021   Essential hypertension 11/03/2021   ARF (acute renal failure) ([facility]) 11/03/2021   Resolved Ambulatory Problems    Diagnosis Date Noted   No Resolved Ambulatory Problems   Past Medical History:  Diagnosis Date   Chronic hip pain    GERD (gastroesophageal reflux disease)    Hypertension    Major depressive disorder    Polio    Vitamin D deficiency     SOCIAL HX:  Social History   Tobacco Use   Smoking status: Never    Passive exposure: Never   Smokeless tobacco: Never  Substance Use Topics   Alcohol use: Not Currently     FAMILY HX: No family history on file.    ALLERGIES:  Allergies  Allergen Reactions   Lisinopril Cough   Biaxin [Clarithromycin] Rash   Penicillins Rash      PERTINENT MEDICATIONS:  Outpatient Encounter Medications as of 11/27/2021  Medication Sig    acetaminophen (TYLENOL) 500 MG tablet Take 500 mg by mouth every 8 (eight) hours as needed (pain).   atorvastatin (LIPITOR) 10 MG tablet Take 10 mg by mouth at bedtime.   calcitonin, salmon, (MIACALCIN/FORTICAL) 200 UNIT/ACT nasal spray Place 1 spray into alternate nostrils daily.   celecoxib (CELEBREX) 200 MG capsule Take 200 mg by mouth 2 (two) times daily. 8am, 5pm   Cholecalciferol (VITAMIN D3) 50 MCG (2000 UT) capsule Take 2,000 Units by mouth every morning.   citalopram (CELEXA) 20 MG tablet Take 20 mg by mouth every morning.   DULoxetine (CYMBALTA) 60 MG capsule Take 60 mg by mouth at bedtime.   ferrous sulfate 325 (65 FE) MG tablet Take 325 mg by mouth every evening.   fexofenadine (ALLEGRA) 180 MG tablet Take 180 mg by mouth every morning.   fluticasone (FLONASE) 50 MCG/ACT nasal spray Place 1 spray into both nostrils daily as needed (seasonal allergies).   HYDROcodone-acetaminophen (NORCO/VICODIN) 5-325 MG tablet Take 1 tablet by mouth every 8 (eight) hours as needed.   lidocaine 4 % Place 1 patch onto the skin daily.   nystatin (MYCOSTATIN/NYSTOP) powder Apply 1 application. topically 3 (three) times daily.   omeprazole (PRILOSEC) 20 MG capsule Take 20 mg by mouth daily before breakfast.   ondansetron (ZOFRAN) 4 MG tablet Take 4 mg by mouth every 8 (eight) hours as needed for nausea or vomiting.   risedronate (ACTONEL)  35 MG tablet Take 35 mg by mouth every 7 (seven) days. with water on empty stomach, nothing by mouth or lie down for next 30 minutes.   tiZANidine (ZANAFLEX) 4 MG tablet Take 4 mg by mouth every 8 (eight) hours as needed for muscle spasms.   traZODone (DESYREL) 100 MG tablet Take 100 mg by mouth at bedtime as needed for sleep.   No facility-administered encounter medications on file as of 11/27/2021.   I spent 60 minutes providing this consultation; this includes time spent with patient/family, chart review and documentation. More than 50% of the time in this  consultation was spent on counseling and coordinating communication   Thank you for the opportunity to participate in the care of Ms. Villasenor.  The palliative care team will continue to follow. Please call our office at [phone] if we can be of additional assistance.   Note: Portions of this note were generated with [employer]. Dictation errors may occur despite best attempts at proofreading.  Teodoro Spray, NP

## 2021-11-30 ENCOUNTER — Telehealth: Payer: Self-pay | Admitting: Hospice

## 2021-11-30 DIAGNOSIS — Z515 Encounter for palliative care: Secondary | ICD-10-CM

## 2021-11-30 NOTE — Telephone Encounter (Signed)
NP called Tami and offered condolences on the passing of patient. Tami shared she was present with patient to the end. She expressed appreciation for the call.

## 2021-12-10 DEATH — deceased

## 2023-10-20 IMAGING — CT CT HEAD W/O CM
4 series · 16 of 47 positions shown, 18 images · non-contrast
Comparison: None.

CLINICAL DATA: Mental status change.  Altered mental status



[Series 3: head without · axial · non-contrast · 0.50mm/px · z∈[-180,-60]mm · 7 of 32 slices shown, 9 images]
[im 4/32  brain]
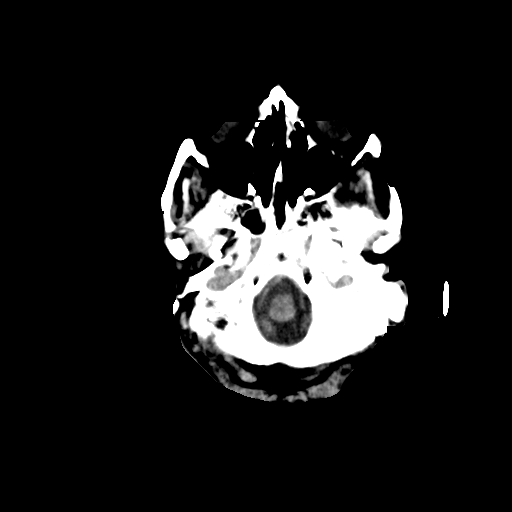
[im 4/32  bone]
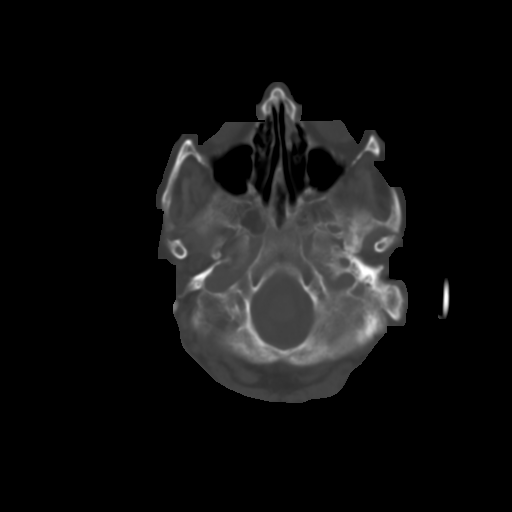
[im 8/32  brain]
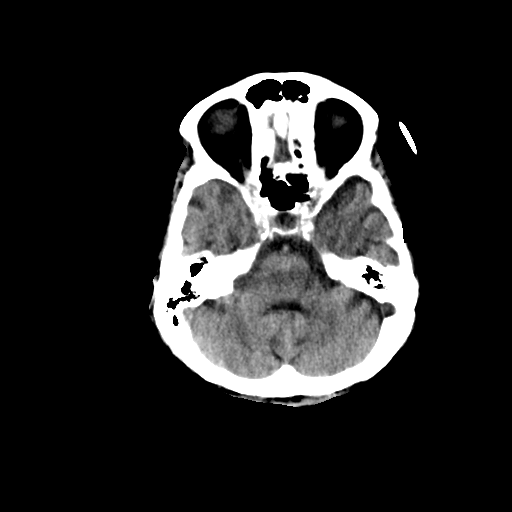
[im 12/32  brain]
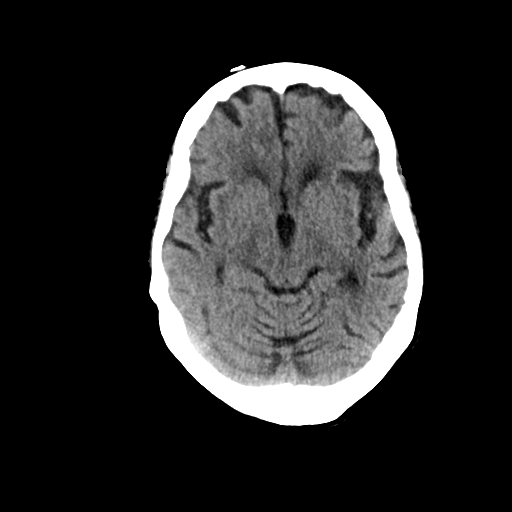
[im 16/32  brain]
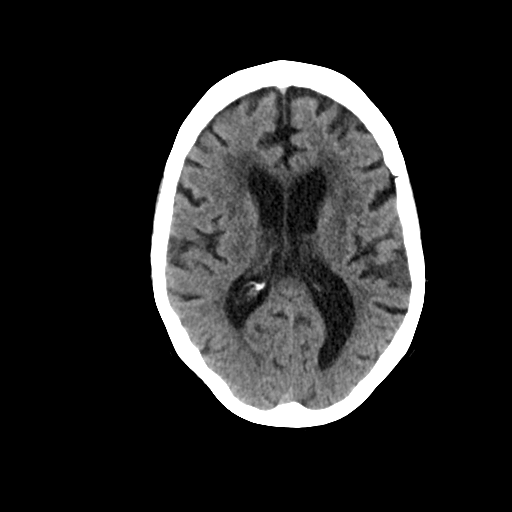
[im 20/32  brain]
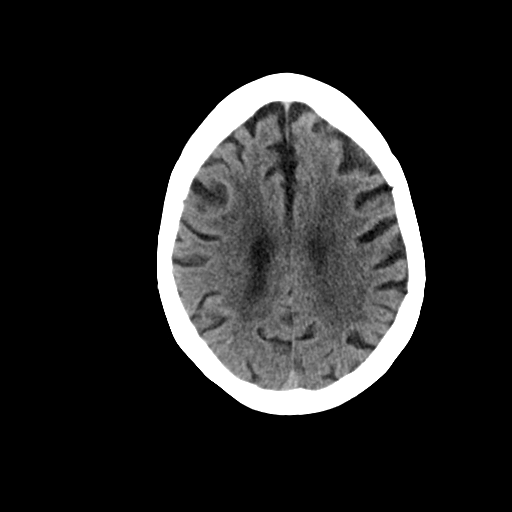
[im 20/32  bone]
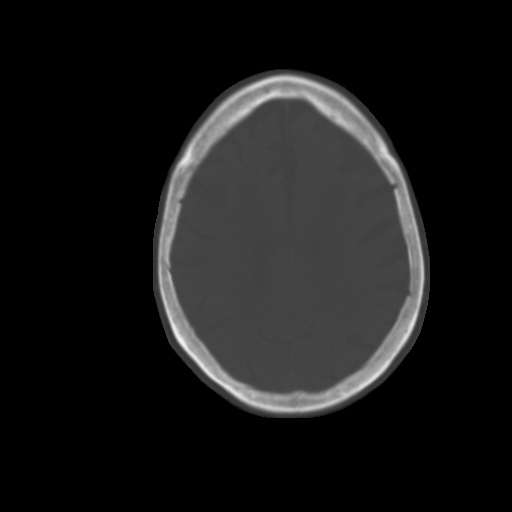
[im 24/32  brain]
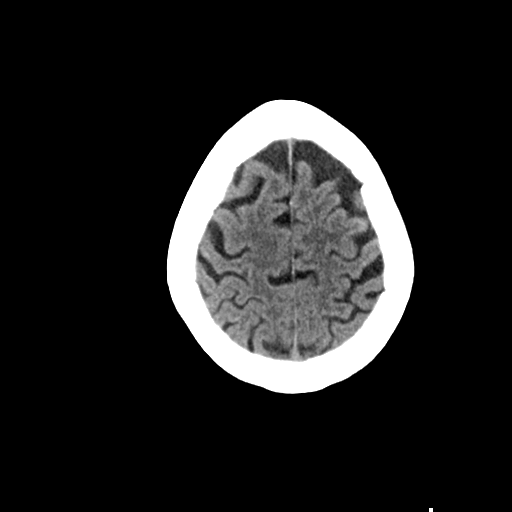
[im 28/32  brain]
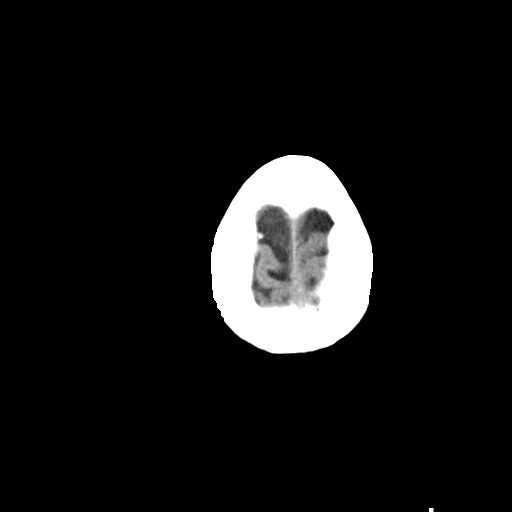

[Series 4: head bone · axial · 0.50mm/px · z∈[-180,-148]mm · 3 of 80 slices shown]
[im 8/80  bone]
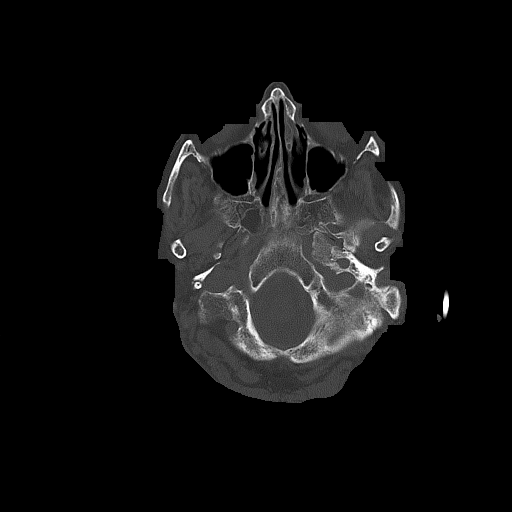
[im 16/80  bone]
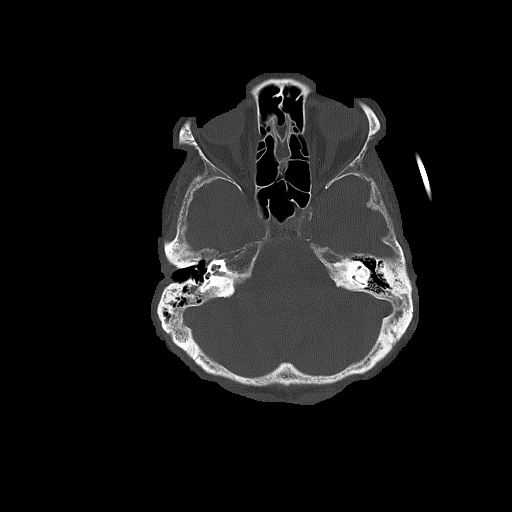
[im 24/80  bone]
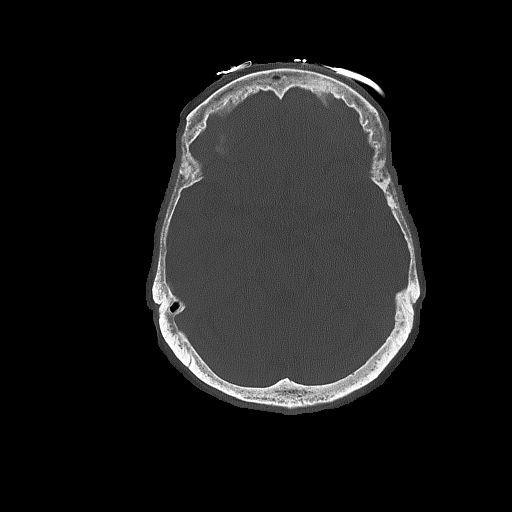

[Series 5: head without cor · coronal · non-contrast · 0.33mm/px · 3 of 69 slices shown]
[im 23/69  brain]
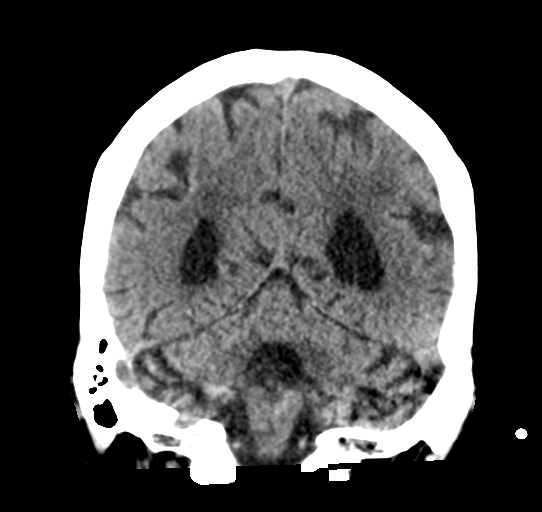
[im 31/69  brain]
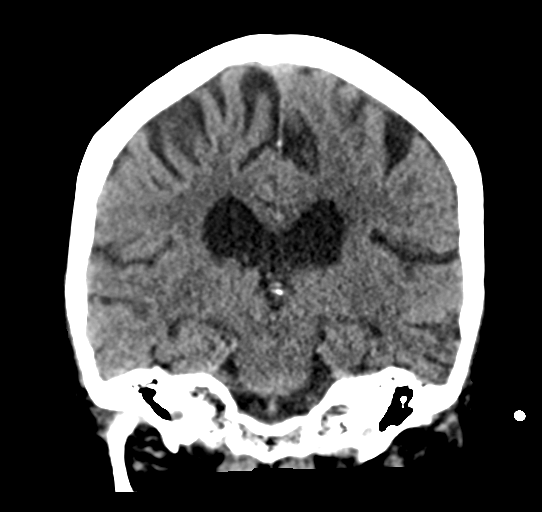
[im 38/69  brain]
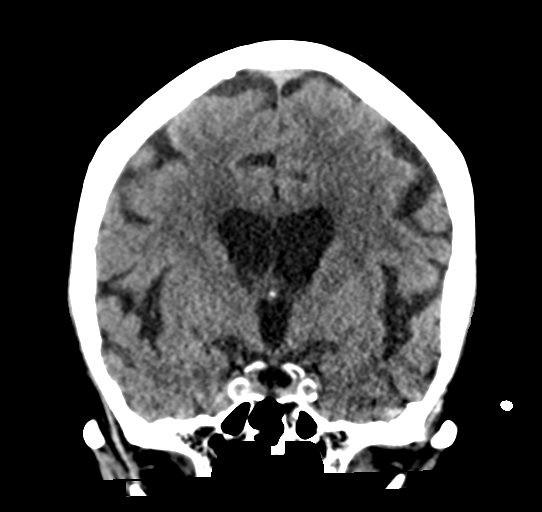

[Series 6: head without sag · sagittal · non-contrast · 0.31mm/px · 3 of 67 slices shown]
[im 23/67  brain]
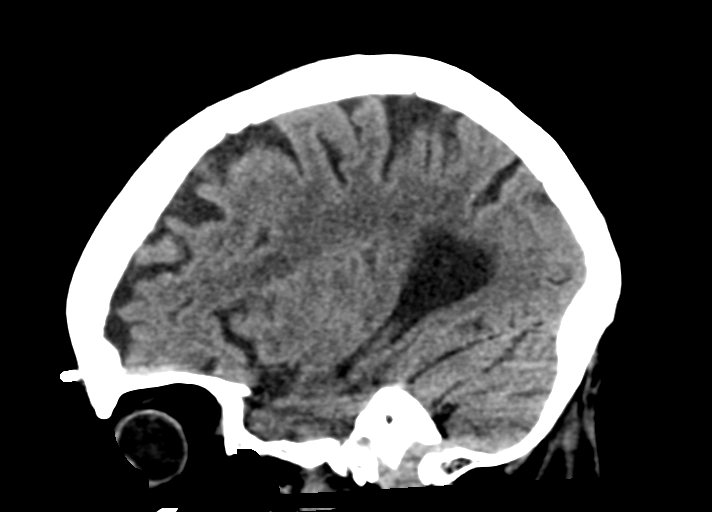
[im 34/67  brain]
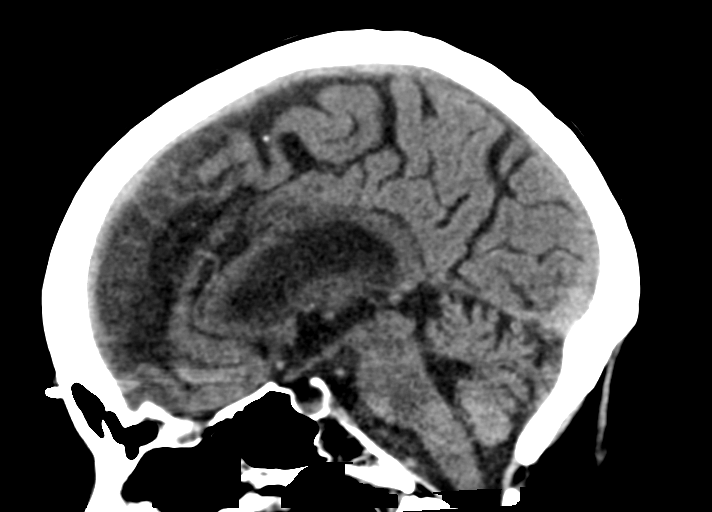
[im 45/67  brain]
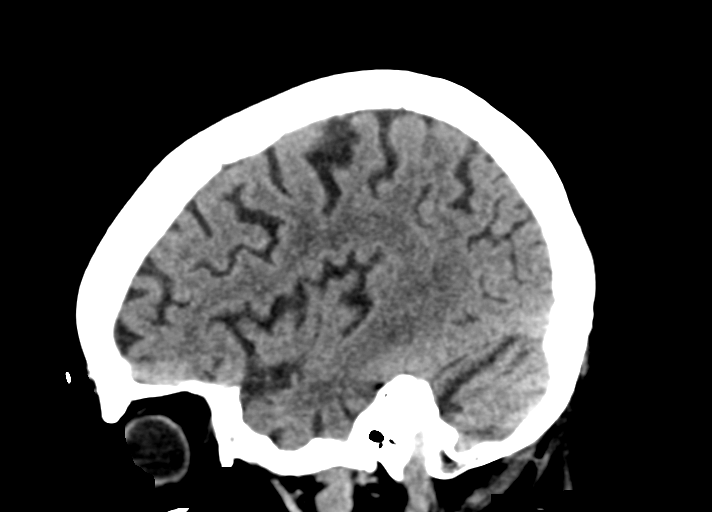

[16 of 47 positions shown; findings below may reference images not displayed]

FINDINGS: Brain: No acute intracranial hemorrhage. No focal mass lesion. No CT
evidence of acute infarction. No midline shift or mass effect. No
hydrocephalus. Basilar cisterns are patent.

There are periventricular and subcortical white matter
hypodensities. Generalized cortical atrophy.

Vascular: No hyperdense vessel or unexpected calcification.

Skull: Normal. Negative for fracture or focal lesion.

Sinuses/Orbits: Paranasal sinuses and mastoid air cells are clear.
Orbits are clear.

Other: None.
IMPRESSION: 1. No acute intracranial findings.
2. Atrophy and white matter microvascular disease

## 2023-10-20 IMAGING — CT CT ABD-PELV W/O CM
2 of 4 series · 16 of 46 positions shown, 18 images · non-contrast
Comparison: None.

CLINICAL DATA: Lethargy



[Series 3: a/p w/o 5mm · axial · non-contrast · 0.98mm/px · z∈[-845,-420]mm · 13 of 95 slices shown, 15 images]
[im 5/95  soft-tissue]
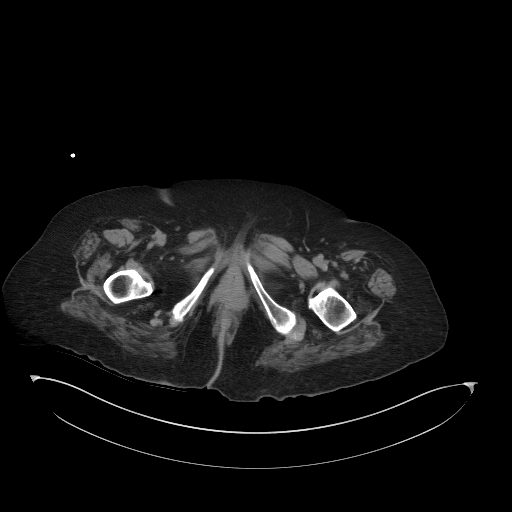
[im 5/95  bone]
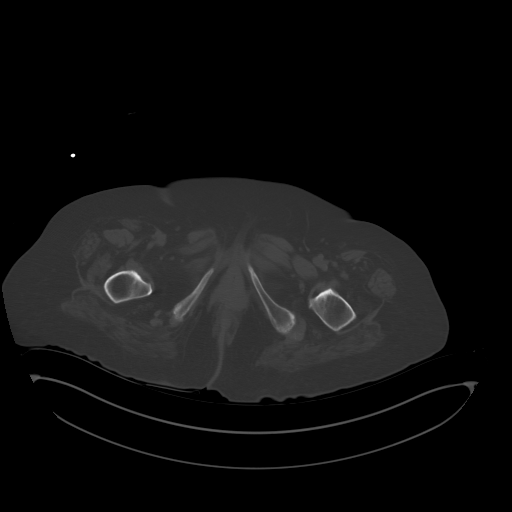
[im 13/95  soft-tissue]
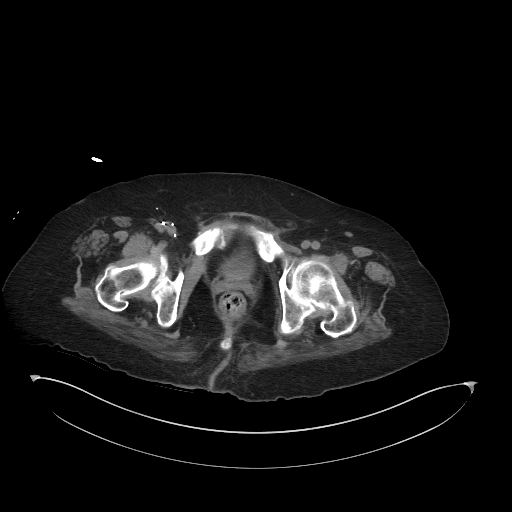
[im 21/95  soft-tissue]
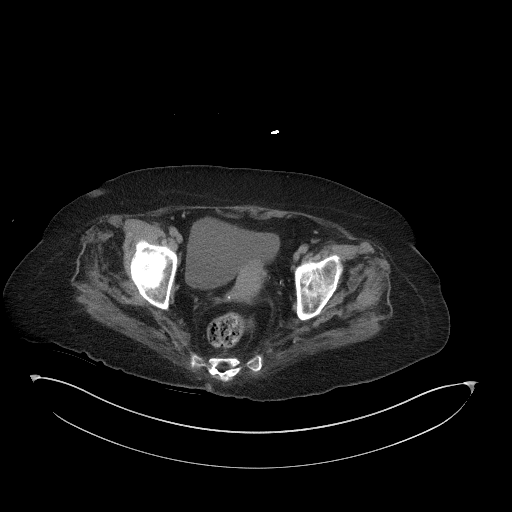
[im 25/95  soft-tissue]
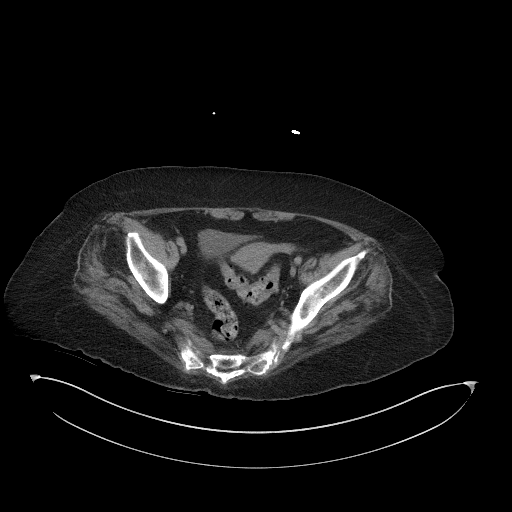
[im 33/95  soft-tissue]
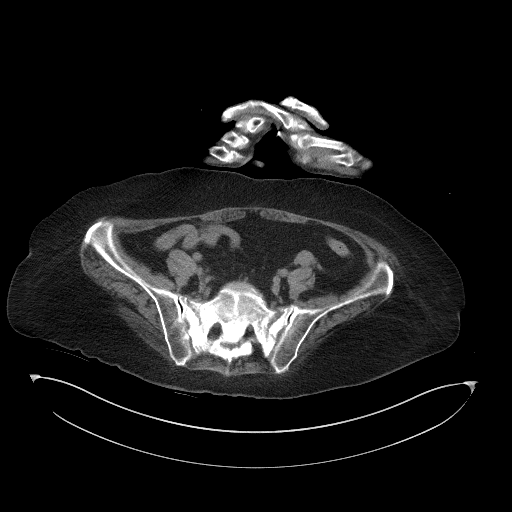
[im 41/95  soft-tissue]
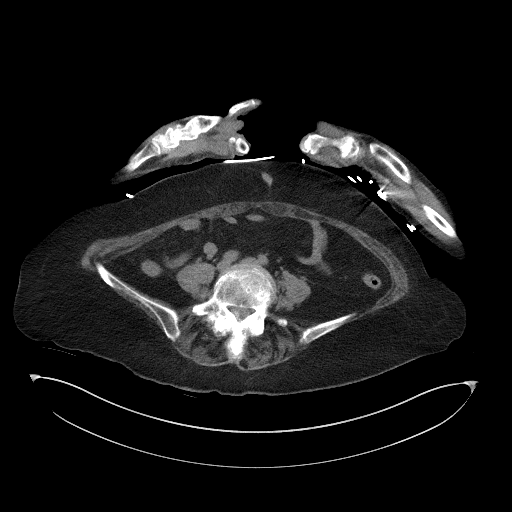
[im 50/95  soft-tissue]
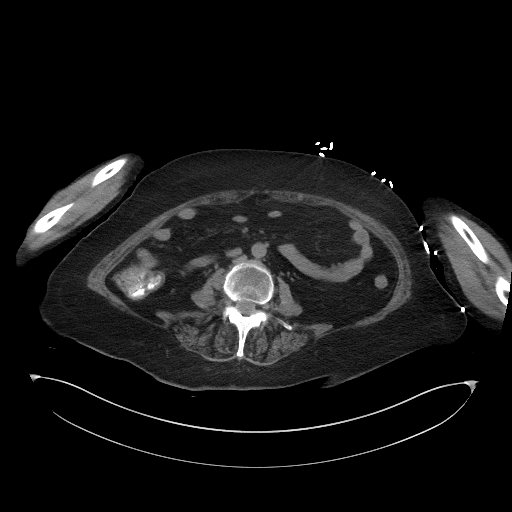
[im 54/95  soft-tissue]
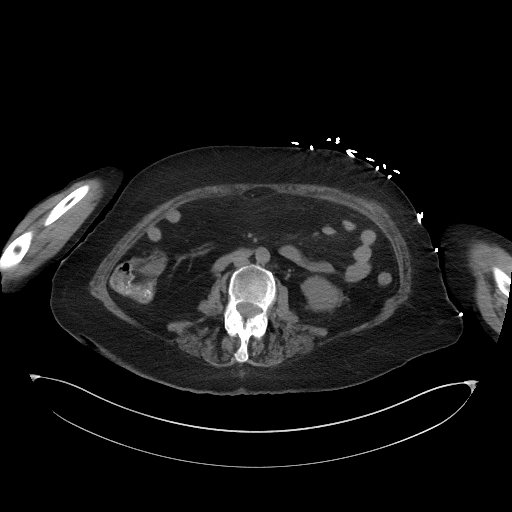
[im 62/95  soft-tissue]
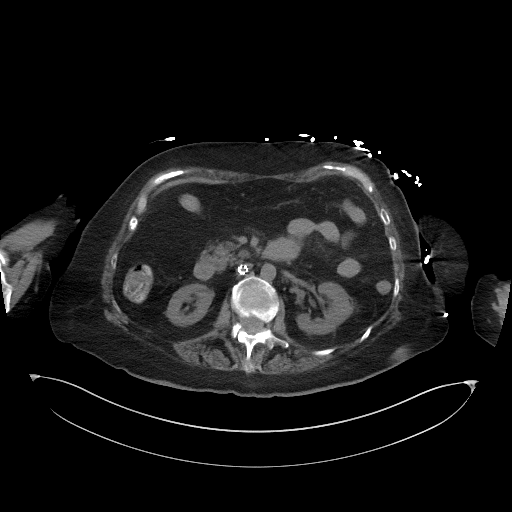
[im 62/95  bone]
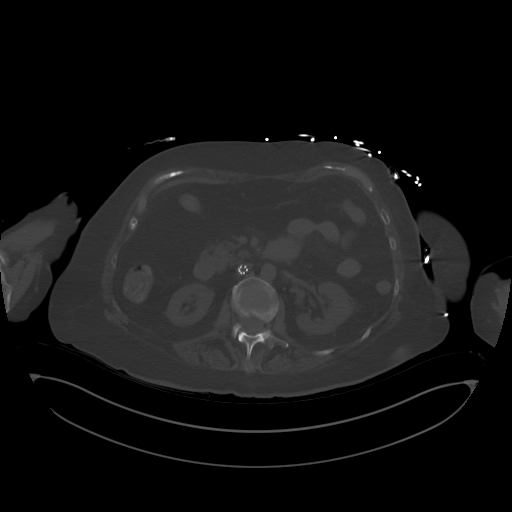
[im 70/95  soft-tissue]
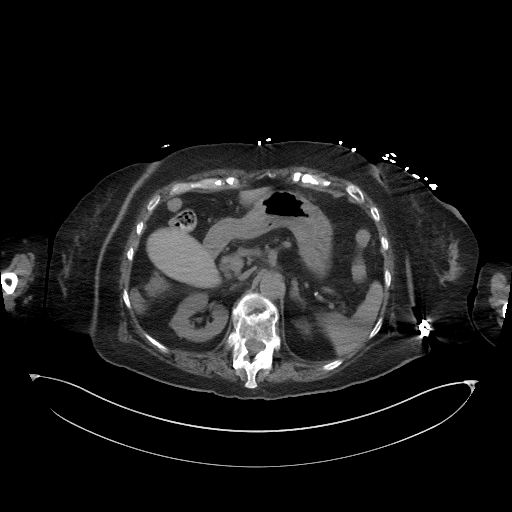
[im 74/95  soft-tissue]
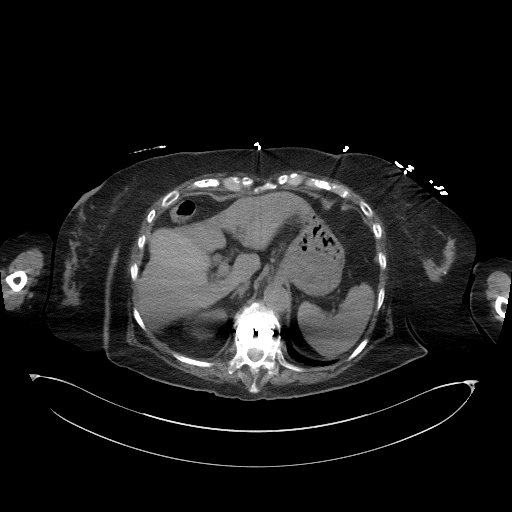
[im 82/95  soft-tissue]
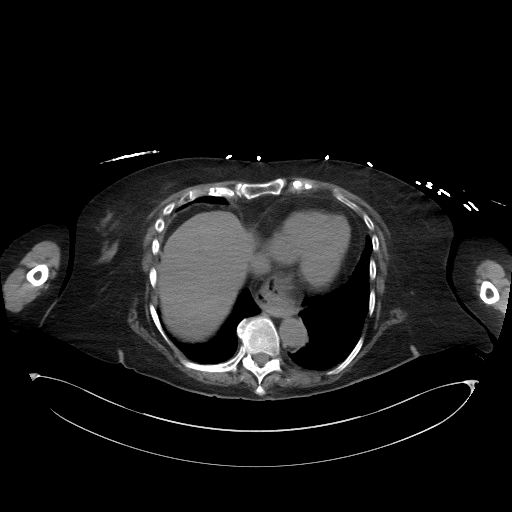
[im 90/95  soft-tissue]
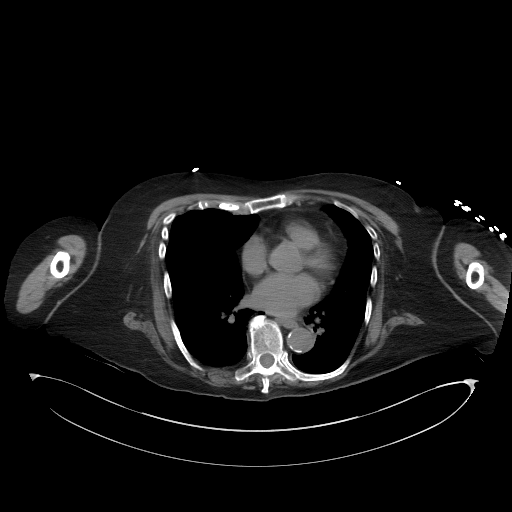

[Series 6: a/p w/o cor · coronal · non-contrast · 0.91mm/px · 3 of 151 slices shown]
[im 51/151  soft-tissue]
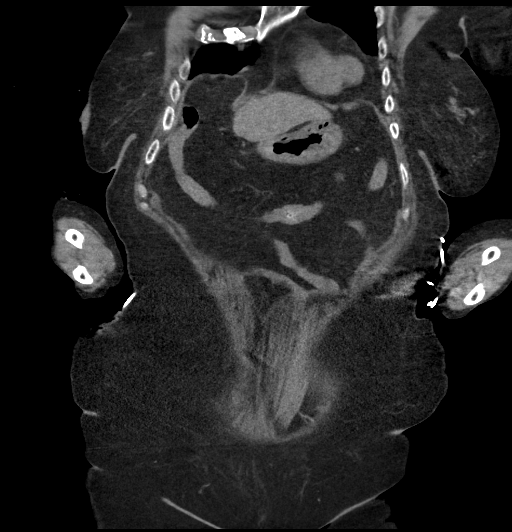
[im 67/151  soft-tissue]
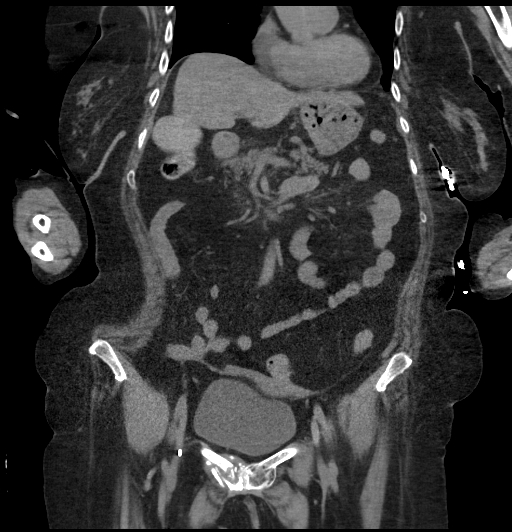
[im 84/151  soft-tissue]
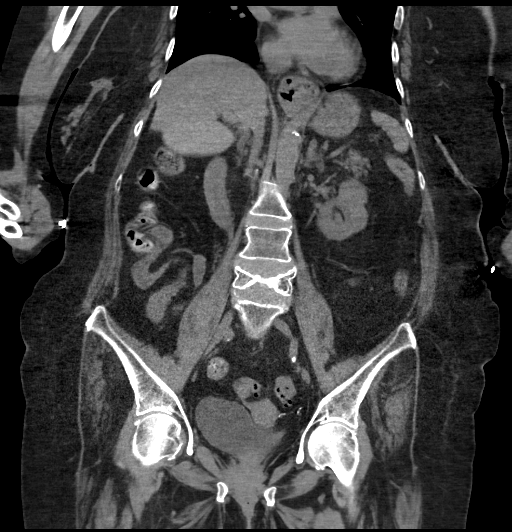

[16 of 46 positions shown; findings below may reference images not displayed]

FINDINGS: Lower chest: Lung bases demonstrate no acute consolidation or
effusion. Moderate hiatal hernia.

Hepatobiliary: Distended gallbladder. Hyperdense material in the
gallbladder possibly sludge. No inflammation. No biliary dilatation

Pancreas: Unremarkable. No pancreatic ductal dilatation or
surrounding inflammatory changes.

Spleen: Normal in size without focal abnormality.

Adrenals/Urinary Tract: Adrenal glands are normal. Kidneys show no
hydronephrosis. The bladder is unremarkable

Stomach/Bowel: The stomach is nonenlarged. No dilated small bowel.
No acute bowel wall thickening. Diverticular disease of the left
colon

Vascular/Lymphatic: IVC filter with apex at the renal vein
confluence. Nonaneurysmal aorta with atherosclerosis. No suspicious
lymph nodes.

Reproductive: Uterus and bilateral adnexa are unremarkable.

Other: Negative for pelvic effusion or free air

Musculoskeletal: Subacute appearing fracture involving the right
pubic bone extends to the symphysis, no symphyseal widening. Chronic
left pubic bone fracture. Subacute appearing bilateral sacral
insufficiency fractures with heterogeneous sclerosis. Age
indeterminate compression fractures at T7, superior endplate T10,
moderate compression fracture T12, and moderate severe compression
fracture L4. Treated compression fracture at T11.
IMPRESSION: 1. Distended gallbladder with hyperdense material, possible sludge.
No definitive inflammation, follow-up ultrasound as indicated
2. Diverticular disease of left colon without acute wall thickening
3. Subacute appearing right pubic bone fracture extending to the
symphysis. Subacute appearing bilateral sacral insufficiency
fractures. Age indeterminate compression fractures at T7, T10, T12
and L4.
